# Patient Record
Sex: Male | Born: 1945 | Race: White | Hispanic: No | Marital: Married | Smoking: Current every day smoker
Health system: Southern US, Community
[De-identification: ages and names within clinical notes are randomized; demographics above are authoritative.]

## PROBLEM LIST (undated history)

## (undated) DIAGNOSIS — E119 Type 2 diabetes mellitus without complications: Secondary | ICD-10-CM

## (undated) DIAGNOSIS — F1911 Other psychoactive substance abuse, in remission: Secondary | ICD-10-CM

## (undated) DIAGNOSIS — N289 Disorder of kidney and ureter, unspecified: Secondary | ICD-10-CM

## (undated) DIAGNOSIS — Z720 Tobacco use: Secondary | ICD-10-CM

## (undated) DIAGNOSIS — Z7901 Long term (current) use of anticoagulants: Secondary | ICD-10-CM

## (undated) DIAGNOSIS — J329 Chronic sinusitis, unspecified: Secondary | ICD-10-CM

## (undated) DIAGNOSIS — I4891 Unspecified atrial fibrillation: Principal | ICD-10-CM

## (undated) DIAGNOSIS — I5023 Acute on chronic systolic (congestive) heart failure: Secondary | ICD-10-CM

## (undated) HISTORY — DX: Type 2 diabetes mellitus without complications: E11.9

## (undated) HISTORY — PX: NASAL SEPTUM SURGERY: SHX37

---

## 2011-04-19 ENCOUNTER — Encounter (HOSPITAL_COMMUNITY): Payer: Self-pay | Admitting: Emergency Medicine

## 2011-04-19 ENCOUNTER — Emergency Department (INDEPENDENT_AMBULATORY_CARE_PROVIDER_SITE_OTHER)
Admission: EM | Admit: 2011-04-19 | Discharge: 2011-04-19 | Disposition: A | Discharge status: Home / Self Care | Payer: BC Managed Care – PPO | Source: Home / Self Care | Attending: Family Medicine | Admitting: Family Medicine

## 2011-04-19 DIAGNOSIS — R059 Cough, unspecified: Secondary | ICD-10-CM

## 2011-04-19 DIAGNOSIS — J310 Chronic rhinitis: Secondary | ICD-10-CM

## 2011-04-19 DIAGNOSIS — R05 Cough: Secondary | ICD-10-CM

## 2011-04-19 HISTORY — DX: Chronic sinusitis, unspecified: J32.9

## 2011-04-19 MED ORDER — FLUTICASONE PROPIONATE 50 MCG/ACT NA SUSP: 2.0000 | Freq: Every day | NASAL | Status: DC

## 2011-04-19 MED ORDER — GUAIFENESIN-CODEINE 100-10 MG/5ML PO SYRP: 5.0000 mL | ORAL_SOLUTION | Freq: Four times a day (QID) | ORAL | Status: AC | PRN

## 2011-04-19 NOTE — ED Provider Notes (Signed)
History     CSN: [id number]  Arrival date & time 04/19/11  5366   First MD Initiated Contact with Patient 04/19/11 0831      Chief Complaint  Patient presents with  . Cough  . Sinusitis    (Consider location/radiation/quality/duration/timing/severity/associated sxs/prior treatment) HPI Comments: Jadore presents for evaluation of persistent non-productive cough over the last 2 days. He reports worsening of symptoms at night, when he is supine. He denies fever. He does smoke cigarettes. He reports reduction in sleep secondary to post-nasal drainage and cough.   Patient is a 66 y.o. male presenting with cough. The history is provided by the patient.  Cough This is a new problem. The current episode started 2 days ago. The problem occurs constantly. The problem has not changed since onset.The cough is non-productive. Associated symptoms include ear pain, rhinorrhea and shortness of breath. Pertinent negatives include no chest pain, no chills, no sore throat and no wheezing. He has tried nothing for the symptoms. He is a smoker. His past medical history does not include bronchitis, pneumonia, COPD, emphysema or asthma.    Past Medical History  Diagnosis Date  . Sinusitis     Past Surgical History  Procedure Date  . Nasal septum surgery     30 YRS AGO    No family history on file.  History  Substance Use Topics  . Smoking status: Current Everyday Smoker  . Smokeless tobacco: Not on file  . Alcohol Use: No      Review of Systems  Constitutional: Negative.  Negative for fever and chills.  HENT: Positive for ear pain, congestion, rhinorrhea and postnasal drip. Negative for sore throat and sinus pressure.   Eyes: Negative.   Respiratory: Positive for cough and shortness of breath. Negative for wheezing.   Cardiovascular: Negative.  Negative for chest pain.  Gastrointestinal: Negative.   Genitourinary: Negative.   Musculoskeletal: Negative.   Skin: Negative.   Neurological:  Negative.     Allergies  Review of patient's allergies indicates no known allergies.  Home Medications   Current Outpatient Rx  Name Route Sig Dispense Refill  . FLUTICASONE PROPIONATE 50 MCG/ACT NA SUSP Nasal Place 2 sprays into the nose daily. 16 g 2  . GUAIFENESIN-CODEINE 100-10 MG/5ML PO SYRP Oral Take 5 mLs by mouth every 6 (six) hours as needed for cough or congestion. 120 mL 0    BP 130/87  Pulse 98  Temp(Src) 97.7 F (36.5 C) (Oral)  Resp 22  SpO2 96%  Physical Exam  Nursing note and vitals reviewed. Constitutional: He is oriented to person, place, and time. He appears well-developed and well-nourished.  HENT:  Head: Normocephalic and atraumatic.  Right Ear: Tympanic membrane is retracted. A middle ear effusion is present.  Left Ear: Tympanic membrane is retracted. A middle ear effusion is present.  Mouth/Throat: Uvula is midline, oropharynx is clear and moist and mucous membranes are normal.  Eyes: EOM are normal.  Neck: Normal range of motion.  Pulmonary/Chest: Effort normal and breath sounds normal. He has no wheezes. He has no rhonchi. He has no rales.  Musculoskeletal: Normal range of motion.  Neurological: He is alert and oriented to person, place, and time.  Skin: Skin is warm and dry.  Psychiatric: His behavior is normal.    ED Course  Procedures (including critical care time)  Labs Reviewed - No data to display No results found.   1. Rhinitis   2. Cough       MDM  rx given for fluticasone and guaifenesin AC        Richardo Priest, MD 04/19/11 [phone]

## 2011-04-19 NOTE — ED Notes (Signed)
PT HERE WITH CONSTANT COUGH,POST NASAL DRAINAGE,GREENISH MUCOUS THAT STARTED YESTERDAY BUT HAS WORSENED WITH SOB ON EXERT.SATS 96% R/A.LUNGS DIMINISHED BUT CLEAR.PT AHS BEEN USING OTC NYQUIL BUT NO RELIEF

## 2011-04-22 ENCOUNTER — Inpatient Hospital Stay (HOSPITAL_COMMUNITY)
Admission: EM | Admit: 2011-04-22 | Discharge: 2011-04-30 | DRG: 544 | Disposition: A | Discharge status: Home / Self Care | Payer: BC Managed Care – PPO | Attending: Internal Medicine | Admitting: Internal Medicine

## 2011-04-22 ENCOUNTER — Emergency Department (HOSPITAL_COMMUNITY): Payer: BC Managed Care – PPO

## 2011-04-22 ENCOUNTER — Other Ambulatory Visit: Payer: Self-pay

## 2011-04-22 ENCOUNTER — Emergency Department (HOSPITAL_COMMUNITY)
Admission: EM | Admit: 2011-04-22 | Discharge: 2011-04-22 | Disposition: A | Discharge status: Nursing Home (Includes ICF) | Payer: BC Managed Care – PPO | Source: Home / Self Care | Attending: Emergency Medicine | Admitting: Emergency Medicine

## 2011-04-22 ENCOUNTER — Encounter (HOSPITAL_COMMUNITY): Payer: Self-pay | Admitting: Emergency Medicine

## 2011-04-22 ENCOUNTER — Encounter (HOSPITAL_COMMUNITY): Payer: Self-pay

## 2011-04-22 DIAGNOSIS — J189 Pneumonia, unspecified organism: Secondary | ICD-10-CM

## 2011-04-22 DIAGNOSIS — I509 Heart failure, unspecified: Secondary | ICD-10-CM | POA: Diagnosis present

## 2011-04-22 DIAGNOSIS — Z79899 Other long term (current) drug therapy: Secondary | ICD-10-CM

## 2011-04-22 DIAGNOSIS — Z23 Encounter for immunization: Secondary | ICD-10-CM

## 2011-04-22 DIAGNOSIS — I4891 Unspecified atrial fibrillation: Principal | ICD-10-CM

## 2011-04-22 DIAGNOSIS — J329 Chronic sinusitis, unspecified: Secondary | ICD-10-CM | POA: Diagnosis present

## 2011-04-22 DIAGNOSIS — F17200 Nicotine dependence, unspecified, uncomplicated: Secondary | ICD-10-CM | POA: Diagnosis present

## 2011-04-22 DIAGNOSIS — N289 Disorder of kidney and ureter, unspecified: Secondary | ICD-10-CM | POA: Diagnosis present

## 2011-04-22 DIAGNOSIS — I5023 Acute on chronic systolic (congestive) heart failure: Secondary | ICD-10-CM

## 2011-04-22 DIAGNOSIS — J9600 Acute respiratory failure, unspecified whether with hypoxia or hypercapnia: Secondary | ICD-10-CM | POA: Diagnosis present

## 2011-04-22 DIAGNOSIS — I059 Rheumatic mitral valve disease, unspecified: Secondary | ICD-10-CM | POA: Diagnosis present

## 2011-04-22 DIAGNOSIS — N182 Chronic kidney disease, stage 2 (mild): Secondary | ICD-10-CM | POA: Insufficient documentation

## 2011-04-22 DIAGNOSIS — J9601 Acute respiratory failure with hypoxia: Secondary | ICD-10-CM | POA: Diagnosis present

## 2011-04-22 DIAGNOSIS — F1911 Other psychoactive substance abuse, in remission: Secondary | ICD-10-CM | POA: Diagnosis present

## 2011-04-22 DIAGNOSIS — I428 Other cardiomyopathies: Secondary | ICD-10-CM

## 2011-04-22 DIAGNOSIS — Z720 Tobacco use: Secondary | ICD-10-CM | POA: Diagnosis present

## 2011-04-22 DIAGNOSIS — Z7901 Long term (current) use of anticoagulants: Secondary | ICD-10-CM

## 2011-04-22 HISTORY — DX: Unspecified atrial fibrillation: I48.91

## 2011-04-22 HISTORY — DX: Tobacco use: Z72.0

## 2011-04-22 HISTORY — DX: Other psychoactive substance abuse, in remission: F19.11

## 2011-04-22 HISTORY — DX: Acute on chronic systolic (congestive) heart failure: I50.23

## 2011-04-22 HISTORY — DX: Disorder of kidney and ureter, unspecified: N28.9

## 2011-04-22 HISTORY — DX: Long term (current) use of anticoagulants: Z79.01

## 2011-04-22 LAB — DIFFERENTIAL
Basophils Absolute: 0 10*3/uL (ref 0.0–0.1)
Eosinophils Absolute: 0.1 10*3/uL (ref 0.0–0.7)
Eosinophils Absolute: 0.1 10*3/uL (ref 0.0–0.7)
Eosinophils Relative: 1 % (ref 0–5)
Lymphocytes Relative: 18 % (ref 12–46)
Lymphs Abs: 1.8 10*3/uL (ref 0.7–4.0)
Neutrophils Relative %: 74 % (ref 43–77)

## 2011-04-22 LAB — GLUCOSE, CAPILLARY

## 2011-04-22 LAB — CBC
MCH: 30.3 pg (ref 26.0–34.0)
MCH: 30.2 pg (ref 26.0–34.0)
MCV: 88.8 fL (ref 78.0–100.0)
Platelets: 306 10*3/uL (ref 150–400)
Platelets: 313 10*3/uL (ref 150–400)
RBC: 4.7 MIL/uL (ref 4.22–5.81)
RDW: 14.4 % (ref 11.5–15.5)
WBC: 9.6 10*3/uL (ref 4.0–10.5)
WBC: 9.5 10*3/uL (ref 4.0–10.5)

## 2011-04-22 LAB — PROTIME-INR
INR: 1.14 (ref 0.00–1.49)
Prothrombin Time: 14.8 seconds (ref 11.6–15.2)

## 2011-04-22 LAB — TSH: TSH: 1.758 u[IU]/mL (ref 0.350–4.500)

## 2011-04-22 LAB — POCT I-STAT, CHEM 8
BUN: 19 mg/dL (ref 6–23)
Calcium, Ion: 1.21 mmol/L (ref 1.12–1.32)
Glucose, Bld: 113 mg/dL — ABNORMAL HIGH (ref 70–99)
Hemoglobin: 15.6 g/dL (ref 13.0–17.0)
Sodium: 140 mEq/L (ref 135–145)
TCO2: 25 mmol/L (ref 0–100)
TCO2: 25 mmol/L (ref 0–100)

## 2011-04-22 LAB — CARDIAC PANEL(CRET KIN+CKTOT+MB+TROPI)
Relative Index: INVALID (ref 0.0–2.5)
Troponin I: <0.3 ng/mL (ref <0.30)

## 2011-04-22 MED ORDER — PNEUMOCOCCAL VAC POLYVALENT 25 MCG/0.5ML IJ INJ
0.5000 mL | INJECTION | INTRAMUSCULAR | Status: AC
  Administered 2011-04-23: 0.5 mL via INTRAMUSCULAR
  Filled 2011-04-22: qty 0.5

## 2011-04-22 MED ORDER — HEPARIN BOLUS VIA INFUSION
2000.0000 [IU] | Freq: Once | INTRAVENOUS | Status: AC
  Administered 2011-04-22: 2000 [IU] via INTRAVENOUS
  Filled 2011-04-22: qty 2000

## 2011-04-22 MED ORDER — HEPARIN SOD (PORCINE) IN D5W 100 UNIT/ML IV SOLN
2450.0000 [IU]/h | INTRAVENOUS | Status: DC
  Administered 2011-04-22: 1400 [IU]/h via INTRAVENOUS
  Administered 2011-04-23: 2450 [IU]/h via INTRAVENOUS
  Administered 2011-04-23: 1850 [IU]/h via INTRAVENOUS
  Administered 2011-04-23 – 2011-04-28 (×9): 2450 [IU]/h via INTRAVENOUS
  Filled 2011-04-22 (×20): qty 250

## 2011-04-22 MED ORDER — DILTIAZEM HCL 100 MG IV SOLR
5.0000 mg/h | INTRAVENOUS | Status: DC
  Administered 2011-04-23: 15 mg/h via INTRAVENOUS
  Filled 2011-04-22: qty 100

## 2011-04-22 MED ORDER — ONDANSETRON HCL 4 MG PO TABS: 4.0000 mg | ORAL_TABLET | Freq: Four times a day (QID) | ORAL | Status: DC | PRN

## 2011-04-22 MED ORDER — NICOTINE 21 MG/24HR TD PT24
21.0000 mg | MEDICATED_PATCH | Freq: Every day | TRANSDERMAL | Status: DC
  Administered 2011-04-23 – 2011-04-30 (×8): 21 mg via TRANSDERMAL
  Filled 2011-04-22 (×9): qty 1

## 2011-04-22 MED ORDER — DILTIAZEM HCL 50 MG/10ML IV SOLN
10.0000 mg | INTRAVENOUS | Status: AC
  Administered 2011-04-22: 10 mg via INTRAVENOUS
  Filled 2011-04-22: qty 2

## 2011-04-22 MED ORDER — SODIUM CHLORIDE 0.9 % IV BOLUS (SEPSIS)
500.0000 mL | Freq: Once | INTRAVENOUS | Status: AC
  Administered 2011-04-22: 500 mL via INTRAVENOUS

## 2011-04-22 MED ORDER — DILTIAZEM HCL 50 MG/10ML IV SOLN
20.0000 mg | Freq: Once | INTRAVENOUS | Status: AC
  Administered 2011-04-22: 20 mg via INTRAVENOUS

## 2011-04-22 MED ORDER — DEXTROSE 5 % IV SOLN
1.0000 g | INTRAVENOUS | Status: DC
  Administered 2011-04-22 – 2011-04-24 (×3): 1 g via INTRAVENOUS
  Filled 2011-04-22 (×3): qty 10

## 2011-04-22 MED ORDER — GUAIFENESIN-CODEINE 100-10 MG/5ML PO SOLN
5.0000 mL | Freq: Four times a day (QID) | ORAL | Status: DC | PRN
  Administered 2011-04-22 – 2011-04-26 (×7): 5 mL via ORAL
  Filled 2011-04-22 (×7): qty 5

## 2011-04-22 MED ORDER — FLUTICASONE PROPIONATE 50 MCG/ACT NA SUSP
2.0000 | Freq: Every day | NASAL | Status: DC
  Administered 2011-04-23 – 2011-04-30 (×7): 2 via NASAL
  Filled 2011-04-22: qty 16

## 2011-04-22 MED ORDER — DILTIAZEM HCL 25 MG/5ML IV SOLN
INTRAVENOUS | Status: AC
  Filled 2011-04-22: qty 5

## 2011-04-22 MED ORDER — DEXTROSE 5 % IV SOLN
1.0000 g | Freq: Once | INTRAVENOUS | Status: AC
  Administered 2011-04-22: 1 g via INTRAVENOUS
  Filled 2011-04-22: qty 10

## 2011-04-22 MED ORDER — SODIUM CHLORIDE 0.9 % IV SOLN
INTRAVENOUS | Status: DC
  Administered 2011-04-22: 10 mL/h via INTRAVENOUS

## 2011-04-22 MED ORDER — DEXTROSE 5 % IV SOLN
500.0000 mg | Freq: Once | INTRAVENOUS | Status: AC
  Administered 2011-04-22: 500 mg via INTRAVENOUS
  Filled 2011-04-22: qty 500

## 2011-04-22 MED ORDER — METOPROLOL TARTRATE 1 MG/ML IV SOLN
5.0000 mg | Freq: Four times a day (QID) | INTRAVENOUS | Status: DC | PRN
  Administered 2011-04-22 – 2011-04-23 (×3): 5 mg via INTRAVENOUS
  Filled 2011-04-22 (×3): qty 5

## 2011-04-22 MED ORDER — ACETAMINOPHEN 650 MG RE SUPP: 650.0000 mg | Freq: Four times a day (QID) | RECTAL | Status: DC | PRN

## 2011-04-22 MED ORDER — SODIUM CHLORIDE 0.9 % IV SOLN
INTRAVENOUS | Status: DC
  Administered 2011-04-22: 11:00:00 via INTRAVENOUS

## 2011-04-22 MED ORDER — HEPARIN BOLUS VIA INFUSION
5000.0000 [IU] | Freq: Once | INTRAVENOUS | Status: AC
  Administered 2011-04-22: 5000 [IU] via INTRAVENOUS

## 2011-04-22 MED ORDER — DEXTROSE 5 % IV SOLN
500.0000 mg | INTRAVENOUS | Status: DC
  Administered 2011-04-23 – 2011-04-24 (×2): 500 mg via INTRAVENOUS
  Filled 2011-04-22 (×4): qty 500

## 2011-04-22 MED ORDER — ACETAMINOPHEN 325 MG PO TABS: 650.0000 mg | ORAL_TABLET | Freq: Four times a day (QID) | ORAL | Status: DC | PRN

## 2011-04-22 MED ORDER — DILTIAZEM HCL 100 MG IV SOLR
15.0000 mg/h | INTRAVENOUS | Status: DC
  Administered 2011-04-22 (×2): 15 mg/h via INTRAVENOUS

## 2011-04-22 MED ORDER — DILTIAZEM HCL 100 MG IV SOLR
10.0000 mg/h | INTRAVENOUS | Status: DC
  Administered 2011-04-22: 10 mg/h via INTRAVENOUS

## 2011-04-22 MED ORDER — ONDANSETRON HCL 4 MG/2ML IJ SOLN: 4.0000 mg | Freq: Four times a day (QID) | INTRAMUSCULAR | Status: DC | PRN

## 2011-04-22 MED ORDER — LEVALBUTEROL HCL 0.63 MG/3ML IN NEBU
0.6300 mg | INHALATION_SOLUTION | Freq: Four times a day (QID) | RESPIRATORY_TRACT | Status: DC | PRN
  Administered 2011-04-22: 0.63 mg via RESPIRATORY_TRACT
  Filled 2011-04-22 (×2): qty 3

## 2011-04-22 NOTE — ED Notes (Signed)
[facility] notified.   

## 2011-04-22 NOTE — Progress Notes (Signed)
ANTICOAGULATION CONSULT NOTE   Pharmacy Consult for Heparin Indication: atrial fibrillation  No Known Allergies  Patient Measurements: Height: 6\' 2"  (188 cm) Weight: 210 lb (95.255 kg) IBW/kg (Calculated) : 82.2   Vital Signs: Temp: 97.4 F (36.3 C) (01/24 1951) Temp src: Oral (01/24 1951) BP: 101/71 mmHg (01/24 2030) Pulse Rate: 110  (01/24 2100)  Labs:  Basename 04/22/11 2205 04/22/11 2005 04/22/11 1030 04/22/11 1024 04/22/11 0855 04/22/11 0850  HGB -- -- 15.0 14.2 -- --  HCT -- -- 44.0 41.9 46.0 --  PLT -- -- -- 313 -- 306  APTT -- -- -- 38* -- --  LABPROT -- -- -- 14.8 -- --  INR -- -- -- 1.14 -- --  HEPARINUNFRC <0.10* -- -- -- -- --  CREATININE -- -- 1.10 -- 1.10 --  CKTOTAL -- 83 -- -- -- --  CKMB -- 2.7 -- -- -- --  TROPONINI -- <0.30 -- <0.30 -- --   Estimated Creatinine Clearance: 79.9 ml/min (by C-G formula based on Cr of 1.1).  Assessment: 66 year old male with Afib for Heparin  Goal of Therapy:  Heparin level 0.3-0.7 units/ml   Plan:  1) Heparin bolus 2000 units IV  2) Increase Heparin 1850 units / hr 3) Follow-up am labs.   Darryl [name], Darryl [name] 04/22/2011,10:48 PM

## 2011-04-22 NOTE — ED Notes (Addendum)
[facility]-pt is transfer from urgent care. Pt reports continuous sob and cough since tx last week for URI. Pt found to be in rapid afib with rvr, with movement HR up to 180s. Denies chest or sob at this time but [facility] reports pt with sob when talking. 20g(L)hand.

## 2011-04-22 NOTE — H&P (Addendum)
PCP:  [name], MD, MD   DOA:  04/22/2011  9:58 AM  Chief Complaint:  Shortness of breath and post nasal drainage  HPI: This is a 66 years old Caucasian man with no significant medical problems who was seen in urgent care clinic on Monday for postnasal drip and shortness of breath, he was prescribed Flonase nasal spray and cough syrup. He continued to have worsening shortness of breath with minimal exertion associated with postnasal drip and coughing up yellowish mucus sputum production. He denies any chest pain, fever or chills. In the ED EKG showed atrial fibrillation with RVR and chest x-ray showed finding suggestive of pneumonia. He was started on Cardizem drip and given one dose of Rocephin and Zithromax and we are asked to admit for further management  Allergies: No Known Allergies  Prior to Admission medications   Medication Sig Start Date End Date Taking? Authorizing Provider  DM-Doxylamine-Acetaminophen (NYQUIL COLD & FLU PO) Take 30 mLs by mouth at bedtime as needed. For cold symptoms   Yes Historical Provider, MD  fluticasone (FLONASE) 50 MCG/ACT nasal spray Place 2 sprays into the nose daily. 04/19/11 04/18/12 Yes Richardo Priest, MD  guaiFENesin-codeine (ROBITUSSIN AC) 100-10 MG/5ML syrup Take 5 mLs by mouth every 6 (six) hours as needed for cough or congestion. 04/19/11 04/29/11 Yes Richardo Priest, MD    Past Medical History  Diagnosis Date  . Sinusitis     Past Surgical History  Procedure Date  . Nasal septum surgery     30 YRS AGO    Social History: Work  in a warehouse, smokes cigarettes for more about 40 years, used to smoke a pack a day currently smokes half a pack a day. Used to drink alcohol or use illicit drugs in the However quit long time ago as per him.  Family history Mother and father died of lung disease and cancer. His father had coronary heart disease.  Review of Systems:  Constitutional: Denies fever, chills, diaphoresis, appetite change and fatigue.    HEENT: Complain of postnasal drip and congestion. Denies photophobia, eye pain, redness, hearing loss, ear pain, , mouth sores, trouble swallowing, neck pain, neck stiffness and tinnitus.   Respiratory: C/O  SOB, DOE, cough, denies chest tightness,  and wheezing.   Cardiovascular: Denies chest pain, palpitations and leg swelling.  Gastrointestinal: Denies nausea, vomiting, abdominal pain, diarrhea, constipation, blood in stool and abdominal distention.  Genitourinary: Denies dysuria, urgency, frequency, hematuria, flank pain and difficulty urinating.  Musculoskeletal: Denies myalgias, back pain, joint swelling, arthralgias and gait problem.  Skin: Denies pallor, rash and wound.  Neurological: Denies dizziness, seizures, syncope, weakness, light-headedness, numbness and headaches.  Hematological: Denies adenopathy. Easy bruising, personal or family bleeding history  Psychiatric/Behavioral: Denies suicidal ideation, mood changes, confusion, nervousness, sleep disturbance and agitation   Physical Exam:  Filed Vitals:   04/22/11 1130 04/22/11 1200 04/22/11 1245 04/22/11 1315  BP: 122/83 108/79 105/72 113/79  Pulse: 130 40 57 85  Temp:      TempSrc:      Resp: 24 29 25 17   SpO2: 93% 95% 96% 97%    Constitutional: Vital signs reviewed.  Patient is a well-developed and well-nourished in mild  acute distress and cooperative with exam. Alert and oriented x3.  Head: Normocephalic and atraumatic Eyes: PERRL, EOMI, conjunctivae normal, No scleral icterus.  Neck: Supple, Trachea midline normal ROM, No JVD, mass, thyromegaly, or carotid bruit present.  Cardiovascular: Irregular irregular rate and rhythm, no MRG, pulses symmetric and intact  bilaterally Pulmonary/Chest: Right base Rales noted, no wheezes, , or rhonchi Abdominal: Soft. Non-tender, non-distended, bowel sounds are normal, no masses, organomegaly, or guarding present.  Ext: no edema and no cyanosis, pulses palpable bilaterally    Neurological: A&O x3, Strenght is normal and symmetric bilaterally, cranial nerve II-XII are grossly intact, no focal motor deficit, sensory intact to light touch bilaterally.    Labs on Admission:  Results for orders placed during the hospital encounter of 04/22/11 (from the past 48 hour(s))  CBC     Status: Normal   Collection Time   04/22/11 10:24 AM      Component Value Range Comment   WBC 9.5  4.0 - 10.5 (K/uL)    RBC 4.70  4.22 - 5.81 (MIL/uL)    Hemoglobin 14.2  13.0 - 17.0 (g/dL)    HCT 16.1  09.6 - 04.5 (%)    MCV 89.1  78.0 - 100.0 (fL)    MCH 30.2  26.0 - 34.0 (pg)    MCHC 33.9  30.0 - 36.0 (g/dL)    RDW 40.9  81.1 - 91.4 (%)    Platelets 313  150 - 400 (K/uL)   DIFFERENTIAL     Status: Normal   Collection Time   04/22/11 10:24 AM      Component Value Range Comment   Neutrophils Relative 74  43 - 77 (%)    Neutro Abs 7.0  1.7 - 7.7 (K/uL)    Lymphocytes Relative 18  12 - 46 (%)    Lymphs Abs 1.8  0.7 - 4.0 (K/uL)    Monocytes Relative 7  3 - 12 (%)    Monocytes Absolute 0.7  0.1 - 1.0 (K/uL)    Eosinophils Relative 1  0 - 5 (%)    Eosinophils Absolute 0.1  0.0 - 0.7 (K/uL)    Basophils Relative 0  0 - 1 (%)    Basophils Absolute 0.0  0.0 - 0.1 (K/uL)   PROTIME-INR     Status: Normal   Collection Time   04/22/11 10:24 AM      Component Value Range Comment   Prothrombin Time 14.8  11.6 - 15.2 (seconds)    INR 1.14  0.00 - 1.49    APTT     Status: Abnormal   Collection Time   04/22/11 10:24 AM      Component Value Range Comment   aPTT 38 (*) 24 - 37 (seconds)   TROPONIN I     Status: Normal   Collection Time   04/22/11 10:24 AM      Component Value Range Comment   Troponin I <0.30  <0.30 (ng/mL)   POCT I-STAT, CHEM 8     Status: Abnormal   Collection Time   04/22/11 10:30 AM      Component Value Range Comment   Sodium 140  135 - 145 (mEq/L)    Potassium 4.3  3.5 - 5.1 (mEq/L)    Chloride 107  96 - 112 (mEq/L)    BUN 19  6 - 23 (mg/dL)    Creatinine, Ser  7.82  0.50 - 1.35 (mg/dL)    Glucose, Bld 956 (*) 70 - 99 (mg/dL)    Calcium, Ion 2.13  1.12 - 1.32 (mmol/L)    TCO2 25  0 - 100 (mmol/L)    Hemoglobin 15.0  13.0 - 17.0 (g/dL)    HCT 08.6  57.8 - 46.9 (%)     Radiological Exams on Admission: Dg Chest Port 1  View  04/22/2011  *RADIOLOGY REPORT*  Clinical Data: Productive cough  PORTABLE CHEST - 1 VIEW  Comparison: None  Findings: The heart size is normal.  Atelectasis is identified within the left base.  There is a hazy opacity overlying the right base which may represent infiltrate or asymmetric edema.  The visualized osseous structures unremarkable.  IMPRESSION:  1.  Hazy opacity overlying the right base may represent infiltrate, asymmetric edema or posterior layering effusion. Suggest more definitive assessment with upright PA and lateral chest radiograph.  2.  Left base atelectasis.  Original Report Authenticated By: [name], M.D.   Assessment/Plan Principal Problem:  *Atrial fibrillation with RVR Active Problems:  Pneumonia  Sinusitis Plan Admit to step down unit Continue Cardizem drip and titrate, start heparin drip cycle cycle cardiac enzymes Check 2 D echocardiogram and TSH Continue Flonase nasal spray, Rocephin and Zithromax D C IV fluids  [facility]iology service was consulted Nicotine patch, tobacco cessation counseling   Spent on Admission: 50 minutes  Kenley Troop 04/22/2011, 2:34 PM

## 2011-04-22 NOTE — ED Notes (Signed)
Attempting to page admitting MD, triad team 6, to inquire about titratable cardizem and pt bed acuity as tele can not take pt.

## 2011-04-22 NOTE — Progress Notes (Signed)
ANTICOAGULATION CONSULT NOTE - Initial Consult  Pharmacy Consult for Heparin Indication: atrial fibrillation  No Known Allergies  Patient Measurements: Weight: 210 lb (95.255 kg)  Vital Signs: Temp: 97.7 F (36.5 C) (01/24 1000) Temp src: Oral (01/24 1000) BP: 122/87 mmHg (01/24 1415) Pulse Rate: 81  (01/24 1415)  Labs:  Basename 04/22/11 1030 04/22/11 1024 04/22/11 0855 04/22/11 0850  HGB 15.0 14.2 -- --  HCT 44.0 41.9 46.0 --  PLT -- 313 -- 306  APTT -- 38* -- --  LABPROT -- 14.8 -- --  INR -- 1.14 -- --  HEPARINUNFRC -- -- -- --  CREATININE 1.10 -- 1.10 --  CKTOTAL -- -- -- --  CKMB -- -- -- --  TROPONINI -- <0.30 -- --   CrCl is unknown because there is no height on file for the current visit.  Medical History: Past Medical History  Diagnosis Date  . Sinusitis   . History of tobacco abuse   . History of intravenous drug use in remission     Smoked marijuana 1-2/ week up until about a year ago. Snorted and/or smoke cocaine  approximately 1/month until 5 years ago    Medications:  Scheduled:    . azithromycin (ZITHROMAX) 500 MG IVPB  500 mg Intravenous Once  . cefTRIAXone (ROCEPHIN)  IV  1 g Intravenous Once  . diltiazem  10 mg Intravenous To Major  . diltiazem  10 mg Intravenous To Major  . diltiazem  20 mg Intravenous Once  . sodium chloride  500 mL Intravenous Once    Assessment: 66 year old admitted with pneumonia and Afib, Scr = 1.10, to start heparin  Goal of Therapy:  Heparin level 0.3-0.7 units/ml   Plan:  1) Heparin bolus 5000 units IV x 1 2) Heparin drip at 1400 units / hr 3) Heparin level 6 hours after heparin starts 4) Daily heparin level / CBC  Thank you. Elwin Sleight 04/22/2011,3:10 PM

## 2011-04-22 NOTE — ED Notes (Signed)
Spoke to [facility] 

## 2011-04-22 NOTE — Consult Note (Signed)
CARDIOLOGY CONSULT NOTE  Patient ID: Darryl [name] MRN: [id number], DOB/AGE: 1945-12-21   Admit date: 04/22/2011 Date of Consult: 04/22/2011   Primary Physician: [name], MD, MD Primary Cardiologist: new  Pt. Profile: This is a 66 y/o male who presented to [facility] secondary to worsening SOB in the last week and found to be in a-fib upon initial examination. Problem List: Past Medical History  Diagnosis Date  . Sinusitis     A.  s/p sinus surgery in past  . Tobacco abuse     A.  45 yrs 1/2-1ppd - currently 1/2 ppd.  . Atrial fibrillation     A.  04/22/2011  . Pneumonia     A.  03/2011  . History of drug abuse     Smoked marijuana 1-2/ week up until about a year ago. Snorted and/or smoke cocaine  approximately 1/month until 5 years ago    Past Surgical History  Procedure Date  . Nasal septum surgery     30 YRS AGO     Allergies: No Known Allergies  HPI:    66 y/o male with the above problem list.  He hasn't seen a doctor in at least 10 yrs.  He has chronic sinus congestion and about 7-10 days ago his congestion worsened and moved into his chest.  He later developed a prod cough with yellow sputum.  On Sunday of this week, he began to experience DOE and on Monday he came into the ED.  He was treated for sinus congestion and cough and d/c from ED.  Of note, his HR was recorded as being 98 in the ER that day.  No cardiac exam was documented.  Unfortunately, his DOE has progressed and he presented to UC today where he was found to be in a.fib with rvr.  He was transferred to the [facility] ED where he was treated with Dilt bolus and gtt (titrated to 15mg /hr), and also placed on heparin.  His cxr suggest RLL pna and he has been placed on IV abx and admitted by IM.  We have been asked to eval re: afib.  Pt denies c/p, palps.  He has no knowledge as to the duration of his a.fib.  Biggest complaint @ this time is cough.   Inpatient Medications:     . azithromycin (ZITHROMAX) 500 MG  IVPB  500 mg Intravenous Once  . azithromycin  500 mg Intravenous Q24H  . cefTRIAXone (ROCEPHIN)  IV  1 g Intravenous Once  . cefTRIAXone (ROCEPHIN)  IV  1 g Intravenous Q24H  . diltiazem  10 mg Intravenous To Major  . diltiazem  10 mg Intravenous To Major  . diltiazem  20 mg Intravenous Once  . fluticasone  2 spray Each Nare Daily  . heparin  5,000 Units Intravenous Once  . nicotine  21 mg Transdermal Daily  . sodium chloride  500 mL Intravenous Once    Family History  Problem Relation Age of Onset  . Lung cancer Mother     died from CA at age 31  . Lung cancer Father     died from CA at age 76  . Coronary artery disease Father 50    Pt reports his father had a "balloon placed through his arteries."     History   Social History  . Marital Status: Married    Spouse Name: N/A    Number of Children: N/A  . Years of Education: N/A   Occupational History  . 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 Smoking status: Current Everyday Smoker -- 0.5 packs/day for 45 years  . Smokeless tobacco: Not on file  . Alcohol Use: No  . Drug Use: No     has used marijuana and coacaine in past  . Sexually Active: Not on file   Other Topics Concern  . Not on file   Social History Narrative   Pt reports not having seen any medical care x 10 years with exception of visit to [facility] on 04/19/11 for SOB and sinusitis. Also reports no PMH w/ exception of a doctor, "some years ago telling me that my cholesterol was high. So I stopped eating Bologna and it went back down."     Review of Systems: General: negative for chills, fever, night sweats or weight changes.  Cardiovascular: negative for chest pain, edema, orthopnea, palpitations, or paroxysmal nocturnal dyspnea, but does c/o increasing dyspnea on exertion. Dermatological: negative for rash Respiratory: long history of chronic dry, nonproductive cough that now keeps him awake all  night Urologic: negative for hematuria Abdominal: negative for nausea, vomiting, diarrhea, bright red blood per rectum, melena, or hematemesis Neurologic: negative for visual changes, syncope, or dizziness All other systems reviewed and are otherwise negative except as noted above.  Physical Exam: Blood pressure 113/84, pulse 120, temperature 97.7 F (36.5 C), temperature source Oral, resp. rate 18, weight 210 lb (95.255 kg), SpO2 93.00%.  General: Well developed, well nourished, in no acute distress. Head: Normocephalic, atraumatic, sclera non-icteric, no xanthomas, nares are without discharge.  Neck: Supple without bruits or JVD. Lungs: chronic, non-productive cough. Wheezing and rhonchi appreciated bilaterally with decreased air movement in B lower lobe, decreased thoracic expansion and increased respiratory rate at 26. Heart: Unable to hear over breath sounds Abdomen: Soft, non-tender, non-distended, BS + x 4.  Msk:  Strength at 4/5 grossly. Extremities: No clubbing, trace edema. BUE cool to touch with decreased reperfusion time. DP/PT/Radials 2+ and equal bilaterally. Neuro: Alert and oriented X 3. Moves all extremities spontaneously. Psych: Normal affect.   Labs:   Results for orders placed during the hospital encounter of 04/22/11 (from the past 72 hour(s))  CBC     Status: Normal   Collection Time   04/22/11 10:24 AM      Component Value Range Comment   WBC 9.5  4.0 - 10.5 (K/uL)    RBC 4.70  4.22 - 5.81 (MIL/uL)    Hemoglobin 14.2  13.0 - 17.0 (g/dL)    HCT 16.1  09.6 - 04.5 (%)    MCV 89.1  78.0 - 100.0 (fL)    MCH 30.2  26.0 - 34.0 (pg)    MCHC 33.9  30.0 - 36.0 (g/dL)    RDW 40.9  81.1 - 91.4 (%)    Platelets 313  150 - 400 (K/uL)   DIFFERENTIAL     Status: Normal   Collection Time   04/22/11 10:24 AM      Component Value Range Comment   Neutrophils Relative 74  43 - 77 (%)    Neutro Abs 7.0  1.7 - 7.7 (K/uL)    Lymphocytes Relative 18  12 - 46 (%)    Lymphs Abs  1.8  0.7 - 4.0 (K/uL)    Monocytes Relative 7  3 - 12 (%)    Monocytes Absolute 0.7  0.1 - 1.0 (K/uL)    Eosinophils Relative 1  0 - 5 (%)  Eosinophils Absolute 0.1  0.0 - 0.7 (K/uL)    Basophils Relative 0  0 - 1 (%)    Basophils Absolute 0.0  0.0 - 0.1 (K/uL)   PROTIME-INR     Status: Normal   Collection Time   04/22/11 10:24 AM      Component Value Range Comment   Prothrombin Time 14.8  11.6 - 15.2 (seconds)    INR 1.14  0.00 - 1.49    APTT     Status: Abnormal   Collection Time   04/22/11 10:24 AM      Component Value Range Comment   aPTT 38 (*) 24 - 37 (seconds)   TROPONIN I     Status: Normal   Collection Time   04/22/11 10:24 AM      Component Value Range Comment   Troponin I <0.30  <0.30 (ng/mL)   TSH     Status: Normal   Collection Time   04/22/11 10:24 AM      Component Value Range Comment   TSH 1.758  0.350 - 4.500 (uIU/mL)   POCT I-STAT, CHEM 8     Status: Abnormal   Collection Time   04/22/11 10:30 AM      Component Value Range Comment   Sodium 140  135 - 145 (mEq/L)    Potassium 4.3  3.5 - 5.1 (mEq/L)    Chloride 107  96 - 112 (mEq/L)    BUN 19  6 - 23 (mg/dL)    Creatinine, Ser 9.62  0.50 - 1.35 (mg/dL)    Glucose, Bld 952 (*) 70 - 99 (mg/dL)    Calcium, Ion 8.41  1.12 - 1.32 (mmol/L)    TCO2 25  0 - 100 (mmol/L)    Hemoglobin 15.0  13.0 - 17.0 (g/dL)    HCT 32.4  40.1 - 02.7 (%)     Radiology/Studies: Dg Chest Port 1 View  04/22/2011  *RADIOLOGY REPORT*  Clinical Data: Productive cough  PORTABLE CHEST - 1 VIEW  Comparison: None  Findings: The heart size is normal.  Atelectasis is identified within the left base.  There is a hazy opacity overlying the right base which may represent infiltrate or asymmetric edema.  The visualized osseous structures unremarkable.  IMPRESSION:  1.  Hazy opacity overlying the right base may represent infiltrate, asymmetric edema or posterior layering effusion. Suggest more definitive assessment with upright PA and lateral chest  radiograph.  2.  Left base atelectasis.  Original Report Authenticated By: [name], M.D.    EKG: Atrial fibrillation, 161, no acute st/t changes.  ASSESSMENT AND PLAN:   1. CAP:  ABX per IM.  2.  AF rvr:  ? Duration.  Presumably occurred after [facility] ER visit but can't be sure (rate was 98 on Monday - ? Rhythm).  Agree with IV dilt and heparin.  Rate likely driven by #1 @ this point.  If rates difficult to control on dilt alone, will write for prn bb.  CHADS2: 1 (? Htn).  Prob not an ideal coumadin candidate going forward 2/2 lack of f/u in the past however if he doesn't convert while here, we may need to proceed with TEE and DCCV prior to d/c with plan for 4 wk course of anticoagulation.  Check echo, mg, tsh.  3. Tob abuse:  Cessation advised.  Will need formal counseling.    Signed, Nicolasa Ducking, NP 04/22/2011, 6:32 PM  Pt has 1-2 week hx of rapidly progressive DOE which occurs in the  setting of pneumonia and AF RVR of unknown duration.  He also has hx of exertional CP relieved by rest of about 2-3 m duration with CRF +cigs,+Fhx ?lipids and prior Cocaine use.    For now 1) rate control using CCB and prn IV BB 2) heparin and oOAC with warfarin or atlernative agent with plan for DCCV in 3-4 weeks  W CHADS score of 0 (thus far) would not need long term anticoagulation 3)  Will need outpt myoview given CP syndrome 4) lipids

## 2011-04-22 NOTE — ED Notes (Signed)
Spoke to [facility] and states to increase Cardizem to 15mg /hr.

## 2011-04-22 NOTE — ED Notes (Addendum)
Pharmacist states IV Cardizem is compatible with  IV azithromycin and IV cardizem is compatible with rocephin but rocephin and azithromycin are not compatible together.

## 2011-04-22 NOTE — ED Notes (Signed)
Attempted to give report to [id number], states can not take report at this time but will call me back at [phone]

## 2011-04-22 NOTE — ED Notes (Signed)
Seen Monday, reports not feeling any better.  C/o sinus drainage and states his main complaint is sob with routine activity.  Reports routine daily activities are leaving him sob.  Reports sob over the past few weeks.

## 2011-04-22 NOTE — ED Provider Notes (Signed)
History     CSN: [id number]  Arrival date & time 04/22/11  2952   First MD Initiated Contact with Patient 04/22/11 [phone]      Chief Complaint  Patient presents with  . Atrial Fibrillation  . Cough  . Shortness of Breath    (Consider location/radiation/quality/duration/timing/severity/associated sxs/prior treatment) Patient is a 66 y.o. male presenting with atrial fibrillation, cough, and shortness of breath. The history is provided by the patient.  Atrial Fibrillation This is a new problem. Associated symptoms include shortness of breath. Pertinent negatives include no chest pain, no abdominal pain and no headaches.  Cough Associated symptoms include shortness of breath. Pertinent negatives include no chest pain and no headaches.  Shortness of Breath  Associated symptoms include cough and shortness of breath. Pertinent negatives include no chest pain.   patient states he has had cough and fatigue since Sunday. He was seen in urgent care on Monday and started on cough medicines. He went back today and was found to be in atrial fibrillation with RVR. This is new for him. No chest pain. No fevers. He smokes a pack of cigarettes every day and a half. No history of atrial fibrillation. No recent trips. No recent weight loss. He does not have a doctor he sees he is only been taking NyQuil for the cough before Monday.  Past Medical History  Diagnosis Date  . Sinusitis     Past Surgical History  Procedure Date  . Nasal septum surgery     30  YRS AGO    No family history on file.  History  Substance Use Topics  . Smoking status: Current Everyday Smoker  . Smokeless tobacco: Not on file  . Alcohol Use: No      Review of Systems  Constitutional: Positive for fatigue. Negative for activity change and appetite change.  HENT: Negative for neck stiffness.   Eyes: Negative for pain.  Respiratory: Positive for cough and shortness of breath. Negative for chest tightness.     Cardiovascular: Negative for chest pain and leg swelling.  Gastrointestinal: Negative for nausea, vomiting, abdominal pain and diarrhea.  Genitourinary: Negative for flank pain.  Musculoskeletal: Negative for back pain.  Skin: Negative for rash.  Neurological: Negative for weakness, numbness and headaches.  Psychiatric/Behavioral: Negative for behavioral problems.    Allergies  Review of patient's allergies indicates no known allergies.  Home Medications   Current Outpatient Rx  Name Route Sig Dispense Refill  . NYQUIL COLD & FLU PO Oral Take 30 mLs by mouth at bedtime as needed. For cold symptoms    . FLUTICASONE PROPIONATE 50 MCG/ACT NA SUSP Nasal Place 2 sprays into the nose daily. 16 g 2  . GUAIFENESIN-CODEINE 100-10 MG/5ML PO SYRP Oral Take 5 mLs by mouth every 6 (six) hours as needed for cough or congestion. 120 mL 0    BP 105/72  Pulse 57  Temp(Src) 97.7 F (36.5 C) (Oral)  Resp 25  SpO2 96%  Physical Exam  Nursing note and vitals reviewed. Constitutional: He is oriented to person, place, and time. He appears well-developed and well-nourished.  HENT:  Head: Normocephalic and atraumatic.  Eyes: EOM are normal. Pupils are equal, round, and reactive to light.  Neck: Normal range of motion. Neck supple.  Cardiovascular: Normal heart sounds.   No murmur heard.      Tachycardia  Pulmonary/Chest: Effort normal.       Mildly harsh breath sounds  Abdominal: Soft. Bowel sounds are normal. He exhibits no distension  and no mass. There is no tenderness. There is no rebound and no guarding.  Musculoskeletal: Normal range of motion. He exhibits no edema.  Neurological: He is alert and oriented to person, place, and time. No cranial nerve deficit.  Skin: Skin is warm and dry.  Psychiatric: He has a normal mood and affect.    ED Course  Procedures (including critical care time)  Labs Reviewed  APTT - Abnormal; Notable for the following:    aPTT 38 (*)    All other  components within normal limits  POCT I-STAT, CHEM 8 - Abnormal; Notable for the following:    Glucose, Bld 113 (*)    All other components within normal limits  CBC  DIFFERENTIAL  PROTIME-INR  I-STAT, CHEM 8  TROPONIN I  TSH   Dg Chest Port 1 View  04/22/2011  *RADIOLOGY REPORT*  Clinical Data: Productive cough  PORTABLE CHEST - 1 VIEW  Comparison: None  Findings: The heart size is normal.  Atelectasis is identified within the left base.  There is a hazy opacity overlying the right base which may represent infiltrate or asymmetric edema.  The visualized osseous structures unremarkable.  IMPRESSION:  1.  Hazy opacity overlying the right base may represent infiltrate, asymmetric edema or posterior layering effusion. Suggest more definitive assessment with upright PA and lateral chest radiograph.  2.  Left base atelectasis.  Original Report Authenticated By: [name], M.D.     1. Atrial fibrillation with rapid ventricular response   2. CAP (community acquired pneumonia)     Date: 04/22/2011  Rate: 161  Rhythm: atrial fibrillation and RVR  QRS Axis: right  Intervals: normal  ST/T Wave abnormalities: normal  Conduction Disutrbances:afib  Narrative Interpretation:   Old EKG Reviewed: none available  CRITICAL CARE Performed by: Billee Cashing   Total critical care time: 30  Critical care time was exclusive of separately billable procedures and treating other patients.  Critical care was necessary to treat or prevent imminent or life-threatening deterioration.  Critical care was time spent personally by me on the following activities: development of treatment plan with patient and/or surrogate as well as nursing, discussions with consultants, evaluation of patient's response to treatment, examination of patient, obtaining history from patient or surrogate, ordering and performing treatments and interventions, ordering and review of laboratory studies, ordering and review  of radiographic studies, pulse oximetry and re-evaluation of patient's condition.   MDM  Patient presented with shortness of breath and cough for the last several days. Possible pneumonia on x-ray. He treated as acute acquired pneumonia. He also was discovered is a new onset A. fib with RVR. He required repeated boluses of Cardizem and IV infusion of Cardizem to help control the rate. he will be admitted to the hospital        [facility] R. Rubin Payor, MD 04/22/11 1312

## 2011-04-22 NOTE — ED Notes (Signed)
Floor states can not take pt with titratable Cardizem order.  Admitting MD states she was going to put titratable Cardizem order in

## 2011-04-22 NOTE — ED Provider Notes (Signed)
History     CSN: [id number]  Arrival date & time 04/22/11  0810   First MD Initiated Contact with Patient 04/22/11 0825      Chief Complaint  Patient presents with  . URI    (Consider location/radiation/quality/duration/timing/severity/associated sxs/prior treatment) HPI Comments: Im not feeling any better, feeling short of breath even to tie my shoes, for several weeks been getting SOB with even walking, lately been worsening" " still with cough and drainage", no cp, no dizziness  Patient is a 66 y.o. male presenting with URI. The history is provided by the patient. No language interpreter was used.  URI The primary symptoms include cough. Primary symptoms do not include fever, fatigue, headaches, sore throat, abdominal pain, nausea or vomiting. The current episode started 3 to 5 days ago. This is a new problem. The problem has been gradually worsening.  Symptoms associated with the illness include chills, sinus pressure, congestion and rhinorrhea.    Past Medical History  Diagnosis Date  . Sinusitis     A.  s/p sinus surgery in past  . Tobacco abuse     A.  47 yrs 1/2-1ppd - currently 1/2 ppd.  . Atrial fibrillation     A.  04/22/2011  . Pneumonia     A.  03/2011  . History of drug abuse     Smoked marijuana 1-2/ week up until about a year ago. Snorted and/or smoke cocaine  approximately 1/month until 5 years ago    Past Surgical History  Procedure Date  . Nasal septum surgery     30 YRS AGO    Family History  Problem Relation Age of Onset  . Lung cancer Mother     died from CA at age 43  . Lung cancer Father     died from CA at age 30  . Coronary artery disease Father 26    Pt reports his father had a "balloon placed through his arteries."    History  Substance Use Topics  . Smoking status: Current Everyday Smoker -- 0.5 packs/day for 45 years  . Smokeless tobacco: Not on file  . Alcohol Use: No      Review of Systems  Constitutional: Positive for  chills. Negative for fever, diaphoresis and fatigue.  HENT: Positive for congestion, rhinorrhea and sinus pressure. Negative for sore throat.   Respiratory: Positive for cough and shortness of breath.   Cardiovascular: Positive for palpitations. Negative for chest pain and leg swelling.  Gastrointestinal: Negative for nausea, vomiting and abdominal pain.  Neurological: Negative for dizziness, syncope and headaches.    Allergies  Review of patient's allergies indicates no known allergies.  Home Medications   Current Outpatient Rx  Name Route Sig Dispense Refill  . NYQUIL COLD & FLU PO Oral Take 30 mLs by mouth at bedtime as needed. For cold symptoms    . FLUTICASONE PROPIONATE 50 MCG/ACT NA SUSP Nasal Place 2 sprays into the nose daily. 16 g 2  . GUAIFENESIN-CODEINE 100-10 MG/5ML PO SYRP Oral Take 5 mLs by mouth every 6 (six) hours as needed for cough or congestion. 120 mL 0    BP 116/90  Pulse 111  Temp(Src) 97.7 F (36.5 C) (Oral)  Resp 30  SpO2 93%  Physical Exam  Vitals reviewed. Constitutional: He appears distressed.  Eyes: Conjunctivae are normal. No scleral icterus.  Neck: No JVD present.  Cardiovascular: An irregularly irregular rhythm present.  No extrasystoles are present. Exam reveals no gallop and no distant heart  sounds.   Pulmonary/Chest: Not tachypneic. No respiratory distress. He has decreased breath sounds. He has no rhonchi. He has no rales.    ED Course  Procedures (including critical care time)  Labs Reviewed  PRO B NATRIURETIC PEPTIDE - Abnormal; Notable for the following:    Pro B Natriuretic peptide (BNP) 2634.0 (*)    All other components within normal limits  POCT I-STAT, CHEM 8 - Abnormal; Notable for the following:    Glucose, Bld 142 (*)    All other components within normal limits  CBC  DIFFERENTIAL  I-STAT, CHEM 8   Dg Chest Port 1 View  04/22/2011  *RADIOLOGY REPORT*  Clinical Data: Productive cough  PORTABLE CHEST - 1 VIEW  Comparison:  None  Findings: The heart size is normal.  Atelectasis is identified within the left base.  There is a hazy opacity overlying the right base which may represent infiltrate or asymmetric edema.  The visualized osseous structures unremarkable.  IMPRESSION:  1.  Hazy opacity overlying the right base may represent infiltrate, asymmetric edema or posterior layering effusion. Suggest more definitive assessment with upright PA and lateral chest radiograph.  2.  Left base atelectasis.  Original Report Authenticated By: [name], M.D.     No diagnosis found.    MDM  A.F with RVR, ongoing respiratory symptoms, although describes ongoing exertional and worsening dyspnea. Transferred to ED for rate control, have gone up to 160, unresponsive to valsalva. BNP and basic labs drawn. Normotensive. Orders placed during the hospital encounter of 04/22/11  . EKG 12-LEAD  . EKG 12-LEAD             Jimmie Molly, MD 04/22/11 1739

## 2011-04-22 NOTE — ED Notes (Signed)
Patient spoke to son on the phone.  Shirt, hat, coat in belongings bag

## 2011-04-22 NOTE — ED Notes (Signed)
2112-01 Ready 

## 2011-04-23 DIAGNOSIS — J9601 Acute respiratory failure with hypoxia: Secondary | ICD-10-CM | POA: Diagnosis present

## 2011-04-23 DIAGNOSIS — I428 Other cardiomyopathies: Secondary | ICD-10-CM

## 2011-04-23 DIAGNOSIS — I5023 Acute on chronic systolic (congestive) heart failure: Secondary | ICD-10-CM | POA: Diagnosis present

## 2011-04-23 DIAGNOSIS — I517 Cardiomegaly: Secondary | ICD-10-CM

## 2011-04-23 DIAGNOSIS — Z720 Tobacco use: Secondary | ICD-10-CM | POA: Diagnosis present

## 2011-04-23 LAB — BASIC METABOLIC PANEL
BUN: 22 mg/dL (ref 6–23)
Creatinine, Ser: 1.22 mg/dL (ref 0.50–1.35)
GFR calc Af Amer: 71 mL/min — ABNORMAL LOW (ref >90)
GFR calc non Af Amer: 61 mL/min — ABNORMAL LOW (ref >90)

## 2011-04-23 LAB — CARDIAC PANEL(CRET KIN+CKTOT+MB+TROPI)
CK, MB: 2.8 ng/mL (ref 0.3–4.0)
CK, MB: 2.6 ng/mL (ref 0.3–4.0)
Relative Index: INVALID (ref 0.0–2.5)
Total CK: 79 U/L (ref 7–232)
Troponin I: <0.3 ng/mL (ref <0.30)
Troponin I: <0.3 ng/mL (ref <0.30)

## 2011-04-23 LAB — CBC
Hemoglobin: 14.3 g/dL (ref 13.0–17.0)
MCH: 29.9 pg (ref 26.0–34.0)
MCHC: 33.7 g/dL (ref 30.0–36.0)
MCV: 88.7 fL (ref 78.0–100.0)
Platelets: 334 10*3/uL (ref 150–400)
RBC: 4.78 MIL/uL (ref 4.22–5.81)

## 2011-04-23 LAB — PROTIME-INR: Prothrombin Time: 15.2 seconds (ref 11.6–15.2)

## 2011-04-23 MED ORDER — HEPARIN BOLUS VIA INFUSION
2500.0000 [IU] | Freq: Once | INTRAVENOUS | Status: AC
  Administered 2011-04-23: 2500 [IU] via INTRAVENOUS
  Filled 2011-04-23: qty 2500

## 2011-04-23 MED ORDER — LISINOPRIL 5 MG PO TABS
5.0000 mg | ORAL_TABLET | Freq: Every day | ORAL | Status: DC
  Administered 2011-04-23 – 2011-04-30 (×8): 5 mg via ORAL
  Filled 2011-04-23 (×8): qty 1

## 2011-04-23 MED ORDER — CARVEDILOL 6.25 MG PO TABS
6.2500 mg | ORAL_TABLET | Freq: Two times a day (BID) | ORAL | Status: DC
  Administered 2011-04-23 – 2011-04-24 (×2): 6.25 mg via ORAL
  Filled 2011-04-23 (×4): qty 1

## 2011-04-23 MED ORDER — FUROSEMIDE 10 MG/ML IJ SOLN
40.0000 mg | Freq: Two times a day (BID) | INTRAMUSCULAR | Status: DC
  Administered 2011-04-23 – 2011-04-24 (×2): 40 mg via INTRAVENOUS
  Filled 2011-04-23 (×4): qty 4

## 2011-04-23 MED ORDER — AMIODARONE HCL IN DEXTROSE 360-4.14 MG/200ML-% IV SOLN
0.5000 mg/min | INTRAVENOUS | Status: AC
  Administered 2011-04-24 – 2011-04-26 (×6): 0.5 mg/min via INTRAVENOUS
  Filled 2011-04-23 (×15): qty 200

## 2011-04-23 MED ORDER — WARFARIN VIDEO
Freq: Once | Status: AC
  Administered 2011-04-23: 21:00:00

## 2011-04-23 MED ORDER — COUMADIN BOOK
Freq: Once | Status: AC
  Administered 2011-04-23: 23:00:00
  Filled 2011-04-23 (×2): qty 1

## 2011-04-23 MED ORDER — BENZONATATE 100 MG PO CAPS
200.0000 mg | ORAL_CAPSULE | Freq: Three times a day (TID) | ORAL | Status: DC | PRN
  Administered 2011-04-23 (×2): 200 mg via ORAL
  Filled 2011-04-23 (×4): qty 2

## 2011-04-23 MED ORDER — WARFARIN SODIUM 7.5 MG PO TABS
7.5000 mg | ORAL_TABLET | Freq: Once | ORAL | Status: AC
  Administered 2011-04-23: 7.5 mg via ORAL
  Filled 2011-04-23 (×2): qty 1

## 2011-04-23 MED ORDER — AMIODARONE HCL IN DEXTROSE 360-4.14 MG/200ML-% IV SOLN
1.0000 mg/min | INTRAVENOUS | Status: AC
  Administered 2011-04-23 (×2): 1 mg/min via INTRAVENOUS
  Filled 2011-04-23 (×2): qty 200

## 2011-04-23 MED ORDER — AMIODARONE LOAD VIA INFUSION
150.0000 mg | Freq: Once | INTRAVENOUS | Status: AC
  Administered 2011-04-23: 150 mg via INTRAVENOUS
  Filled 2011-04-23: qty 83.34

## 2011-04-23 MED ORDER — SODIUM CHLORIDE 0.9 % IV SOLN
INTRAVENOUS | Status: DC
  Administered 2011-04-23: 10 mL/h via INTRAVENOUS
  Administered 2011-04-27: 250 mL via INTRAVENOUS

## 2011-04-23 NOTE — Consult Note (Signed)
Pt is a 1/2 ppd smoker who is in contemplation stage about quitting. He's cut back some on his smoking as well. Advised and encouraged pt to quit. Discussed available medical aids. Referred to 1-800 quit now for f/u and support. Discussed oral fixation substitutes, second hand smoke and in home smoking policy. Reviewed and gave pt Written education/contact information.

## 2011-04-23 NOTE — Progress Notes (Signed)
ANTICOAGULATION CONSULT NOTE - Follow Up Consult  Pharmacy Consult for heparin Indication: atrial fibrillation   Assessment: 78 yom presenting with CAP and afib with RVR. Patient continues to be in atrial fibrillation, with varied HR 40-100s. Currently on dilt gtt at 15mg /hr, heparin at 2250 units/hr, follow up heparin level still below goal at 0.26. No complications have been noted, appears to be running appropriately per nursing.  Goal of Therapy:  Heparin level 0.3-0.7 units/ml   Plan:  Increase IV heparin to 2450 units/hr Recheck heparin level tonight.  No Known Allergies  Patient Measurements: Height: 6\' 2"  (188 cm) Weight: 210 lb (95.255 kg) IBW/kg (Calculated) : 82.2  Heparin Dosing Weight: 95  Vital Signs: Temp: 97.5 F (36.4 C) (01/25 1139) Temp src: Oral (01/25 1139) BP: 118/91 mmHg (01/25 1100) Pulse Rate: 44  (01/25 0800)  Labs:  Basename 04/23/11 1028 04/23/11 1022 04/23/11 0500 04/23/11 0243 04/22/11 2205 04/22/11 2005 04/22/11 1030 04/22/11 1024 04/22/11 0855 04/22/11 0850  HGB -- -- 14.3 -- -- -- 15.0 -- -- --  HCT -- -- 42.4 -- -- -- 44.0 41.9 -- --  PLT -- -- 334 -- -- -- -- 313 -- 306  APTT -- -- 57* -- -- -- -- 38* -- --  LABPROT -- -- 15.2 -- -- -- -- 14.8 -- --  INR -- -- 1.18 -- -- -- -- 1.14 -- --  HEPARINUNFRC 0.26* -- 0.12* -- <0.10* -- -- -- -- --  CREATININE -- -- 1.22 -- -- -- 1.10 -- 1.10 --  CKTOTAL -- 79 -- 91 -- 83 -- -- -- --  CKMB -- 2.6 -- 2.8 -- 2.7 -- -- -- --  TROPONINI -- <0.30 -- <0.30 -- <0.30 -- -- -- --   Estimated Creatinine Clearance: 72.1 ml/min (by C-G formula based on Cr of 1.22).   Medications:  Infusions:    . sodium chloride 10 mL/hr (04/22/11 1847)  . diltiazem (CARDIZEM) infusion    . diltiazem (CARDIZEM) infusion 15 mg/hr (04/22/11 1733)  . heparin 2,250 Units/hr (04/23/11 0657)  . DISCONTD: sodium chloride Stopped (04/22/11 1459)  . DISCONTD: diltiazem (CARDIZEM) infusion Stopped (04/22/11 1412)    Darryl [name] 04/23/2011,12:54 PM

## 2011-04-23 NOTE — Progress Notes (Signed)
ANTICOAGULATION CONSULT NOTE - Initial Consult  Pharmacy Consult for Coumadin Indication: atrial fibrillation  No Known Allergies  Patient Measurements: Height: 6\' 2"  (188 cm) Weight: 210 lb (95.255 kg) IBW/kg (Calculated) : 82.2  Heparin Dosing Weight:   Vital Signs: Temp: 97.6 F (36.4 C) (01/25 1659) Temp src: Oral (01/25 1659) BP: 131/83 mmHg (01/25 1840) Pulse Rate: 72  (01/25 1700)  Labs:  Basename 04/23/11 1028 04/23/11 1022 04/23/11 0500 04/23/11 0243 04/22/11 2205 04/22/11 2005 04/22/11 1030 04/22/11 1024 04/22/11 0855 04/22/11 0850  HGB -- -- 14.3 -- -- -- 15.0 -- -- --  HCT -- -- 42.4 -- -- -- 44.0 41.9 -- --  PLT -- -- 334 -- -- -- -- 313 -- 306  APTT -- -- 57* -- -- -- -- 38* -- --  LABPROT -- -- 15.2 -- -- -- -- 14.8 -- --  INR -- -- 1.18 -- -- -- -- 1.14 -- --  HEPARINUNFRC 0.26* -- 0.12* -- <0.10* -- -- -- -- --  CREATININE -- -- 1.22 -- -- -- 1.10 -- 1.10 --  CKTOTAL -- 79 -- 91 -- 83 -- -- -- --  CKMB -- 2.6 -- 2.8 -- 2.7 -- -- -- --  TROPONINI -- <0.30 -- <0.30 -- <0.30 -- -- -- --   Estimated Creatinine Clearance: 72.1 ml/min (by C-G formula based on Cr of 1.22).  Medical History: Past Medical History  Diagnosis Date  . Sinusitis     A.  s/p sinus surgery in past  . Tobacco abuse     A.  47 yrs 1/2-1ppd - currently 1/2 ppd.  . Atrial fibrillation     A.  04/22/2011  . Pneumonia     A.  03/2011  . History of drug abuse     Smoked marijuana 1-2/ week up until about a year ago. Snorted and/or smoke cocaine  approximately 1/month until 5 years ago    Medications:  Prescriptions prior to admission  Medication Sig Dispense Refill  . DM-Doxylamine-Acetaminophen (NYQUIL COLD & FLU PO) Take 30 mLs by mouth at bedtime as needed. For cold symptoms      . fluticasone (FLONASE) 50 MCG/ACT nasal spray Place 2 sprays into the nose daily.  16 g  2  . guaiFENesin-codeine (ROBITUSSIN AC) 100-10 MG/5ML syrup Take 5 mLs by mouth every 6 (six) hours as needed for  cough or congestion.  120 mL  0    Assessment: 65yom on heparin and now to start Coumadin for Afib with RVR. Monitor INR closely as patient is also on amiodarone and azithromycin which can increase the INR.  - Baseline INR 1.18 - H/H and Plts wnl - No significant bleeding reported - Warfarin pts: 7  Goal of Therapy:  INR 2-3   Plan:  1. Coumadin 7.5mg  po x 1 today 2. Daily PT/INR 3. Coumadin educational book/video  Cleon Dew [phone] 04/23/2011,7:00 PM

## 2011-04-23 NOTE — Progress Notes (Signed)
[facility] [place] TEAM 8  Subjective: This is a 66 years old Caucasian man with no significant medical problems who was seen in urgent care clinic on Monday for postnasal drip and shortness of breath, where he was prescribed Flonase nasal spray and cough syrup. He continued to have worsening shortness of breath with minimal exertion associated with postnasal drip and coughing up yellowish mucus sputum production. He denies any chest pain, fever or chills. In the ED EKG showed atrial fibrillation with RVR and chest x-ray showed finding suggestive of pneumonia. He was started on Cardizem drip and given one dose of Rocephin and Zithromax.  At present the pt is resting comfortably.  He denies f/c, cp, n/v or abdom pain.  He states that he becomes sob w/ the slightest exertion, but denies sob at rest.    Objective: Weight change:   Intake/Output Summary (Last 24 hours) at 04/23/11 1403 Last data filed at 04/23/11 1100  Gross per 24 hour  Intake 1536.5 ml  Output    801 ml  Net  735.5 ml   Blood pressure 118/91, pulse 44, temperature 97.5 F (36.4 C), temperature source Oral, resp. rate 17, height 6\' 2"  (1.88 m), weight 95.255 kg (210 lb), SpO2 92.00%.  Physical Exam: General: No acute respiratory distress Lungs: Clear to auscultation bilaterally without wheezes or crackles Cardiovascular: Regular rate and irregular rhythm without murmur gallop or rub Abdomen: Nontender, nondistended, soft, bowel sounds positive, no rebound, no ascites, no appreciable mass Extremities: No significant cyanosis or clubbing, 2+ edema bilateral lower extremities  Lab Results:  Basename 04/23/11 0500 04/22/11 2205 04/22/11 1030 04/22/11 0855  NA 135 -- 140 140  K 4.2 -- 4.3 4.4  CL 101 -- 107 106  CO2 24 -- -- --  GLUCOSE 142* -- 113* 142*  BUN 22 -- 19 19  CREATININE 1.22 -- 1.10 1.10  CALCIUM 9.3 -- -- --  MG -- 2.1 -- --  PHOS -- -- -- --    Basename 04/23/11 0500 04/22/11 1030 04/22/11  1024 04/22/11 0850  WBC 10.2 -- 9.5 9.6  NEUTROABS -- -- 7.0 7.2  HGB 14.3 15.0 14.2 --  HCT 42.4 44.0 41.9 --  MCV 88.7 -- 89.1 88.8  PLT 334 -- 313 306    Basename 04/23/11 1022 04/23/11 0243 04/22/11 2005  CKTOTAL 79 91 83  CKMB 2.6 2.8 2.7  CKMBINDEX -- -- --  TROPONINI <0.30 <0.30 <0.30   Micro Results: Recent Results (from the past 240 hour(s))  MRSA PCR SCREENING     Status: Normal   Collection Time   04/22/11  6:33 PM      Component Value Range Status Comment   MRSA by PCR NEGATIVE  NEGATIVE  Final     Studies/Results: All recent x-ray/radiology reports have been reviewed in detail.   Medications: I have reviewed the patient's complete medication list.  Assessment/Plan:  Newly diagnosed Afib w/ RVR Requiring IV cardizem for rate control at present - see plan as per [facility]  Community acquired RLL PNA Continue emperic abx tx - no fever - no elevated WBC  Volume overload Exam suggests that the pt is significantly volume overloaded - I will gently diurese and follow   Tobacco abuse abstinence has been advised  Lonia Blood, MD [facility] Office  [phone] Pager [phone]  On-Call/Text Page:      [email or web address]      password [facility]

## 2011-04-23 NOTE — Progress Notes (Signed)
ANTICOAGULATION CONSULT NOTE   Pharmacy Consult for Heparin Indication: atrial fibrillation  No Known Allergies  Patient Measurements: Height: 6\' 2"  (188 cm) Weight: 210 lb (95.255 kg) IBW/kg (Calculated) : 82.2   Vital Signs: Temp: 97.8 F (36.6 C) (01/25 0400) Temp src: Oral (01/25 0400) BP: 108/77 mmHg (01/25 0300) Pulse Rate: 127  (01/25 0400)  Labs:  Basename 04/23/11 0500 04/23/11 0243 04/22/11 2205 04/22/11 2005 04/22/11 1030 04/22/11 1024 04/22/11 0855 04/22/11 0850  HGB 14.3 -- -- -- 15.0 -- -- --  HCT 42.4 -- -- -- 44.0 41.9 -- --  PLT 334 -- -- -- -- 313 -- 306  APTT 57* -- -- -- -- 38* -- --  LABPROT 15.2 -- -- -- -- 14.8 -- --  INR 1.18 -- -- -- -- 1.14 -- --  HEPARINUNFRC 0.12* -- <0.10* -- -- -- -- --  CREATININE -- -- -- -- 1.10 -- 1.10 --  CKTOTAL -- 91 -- 83 -- -- -- --  CKMB -- 2.8 -- 2.7 -- -- -- --  TROPONINI -- <0.30 -- <0.30 -- <0.30 -- --   Estimated Creatinine Clearance: 79.9 ml/min (by C-G formula based on Cr of 1.1).  Assessment: 66 year old male with Afib for Heparin.  Infusing appropriately overnight per RN  Goal of Therapy:  Heparin level 0.3-0.7 units/ml   Plan:  1) Rebolus Heparin 2500 units IV  2) Increase Heparin 2250 units / hr 3) Check heparin level in 6 hours. `   Kenidy Crossland, [employer] 04/23/2011,5:46 AM

## 2011-04-23 NOTE — Progress Notes (Signed)
Utilization Review Completed.Darryl [name] T1/25/2013   

## 2011-04-23 NOTE — Progress Notes (Signed)
Patient Name: Darryl [name]      SUBJECTIVE:strill short of breath with exertion  Past Medical History  Diagnosis Date  . Sinusitis     A.  s/p sinus surgery in past  . Tobacco abuse     A.  47 yrs 1/2-1ppd - currently 1/2 ppd.  . Atrial fibrillation     A.  04/22/2011  . Pneumonia     A.  03/2011  . History of drug abuse     Smoked marijuana 1-2/ week up until about a year ago. Snorted and/or smoke cocaine  approximately 1/month until 5 years ago    PHYSICAL EXAM Filed Vitals:   04/23/11 1139 04/23/11 1400 04/23/11 1500 04/23/11 1659  BP:  115/78 111/64   Pulse:   68   Temp: 97.5 F (36.4 C)   97.6 F (36.4 C)  TempSrc: Oral   Oral  Resp: 17  19   Height:      Weight:      SpO2: 92%  96%    Well developed and nourished in no acute distress HENT normal Neck supple with JVP-flat Carotids brisk and full without bruits rhonchi and decreased breathsounds Irregularly irregular rate and rhythm with  /rapid ventricular response, no murmurs or gallops Abd-soft with active BS without hepatomegaly No Clubbing cyanosis 1-2 edema Skin-warm and dry A & Oriented  Grossly normal sensory and motor function  TELEMETRY: Reviewed telemetry pt in AF the HR of 53 leisted on the snapshot are not valid:    Intake/Output Summary (Last 24 hours) at 04/23/11 1811 Last data filed at 04/23/11 1600  Gross per 24 hour  Intake   1902 ml  Output   1001 ml  Net    901 ml    LABS: Basic Metabolic Panel:  Lab 04/23/11 1610 04/22/11 2205 04/22/11 1030 04/22/11 0855  NA 135 -- 140 140  K 4.2 -- 4.3 4.4  CL 101 -- 107 106  CO2 24 -- -- --  GLUCOSE 142* -- 113* 142*  BUN 22 -- 19 19  CREATININE 1.22 -- 1.10 1.10  CALCIUM 9.3 -- -- --  MG -- 2.1 -- --  PHOS -- -- -- --   Cardiac Enzymes:  Basename 04/23/11 1022 04/23/11 0243 04/22/11 2005  CKTOTAL 79 91 83  CKMB 2.6 2.8 2.7  CKMBINDEX -- -- --  TROPONINI <0.30 <0.30 <0.30   CBC:  Lab 04/23/11 0500 04/22/11 1030 04/22/11 1024  04/22/11 0855 04/22/11 0850  WBC 10.2 -- 9.5 -- 9.6  NEUTROABS -- -- 7.0 -- 7.2  HGB 14.3 15.0 14.2 15.6 14.6  HCT 42.4 44.0 41.9 46.0 42.8  MCV 88.7 -- 89.1 -- 88.8  PLT 334 -- 313 -- 306   PROTIME:  Basename 04/23/11 0500 04/22/11 1024  LABPROT 15.2 14.8  INR 1.18 1.14     Basename 04/23/11 0500  TSH 2.303  T4TOTAL --  T3FREE --  THYROIDAB --   ECHO  - Left ventricle: The cavity size was normal. Wall thickness was increased in a pattern of mild LVH. Systolic function was severely reduced. The estimated ejection fraction was in the range of 20% to 25%. Diffuse hypokinesis. Doppler parameters are consistent with high ventricular filling pressure. - Left atrium: The atrium was mildly dilated. - Right ventricle: The cavity size was moderately dilated. - Right atrium: The atrium was mildly dilated.     ASSESSMENT AND PLAN:  Patient Active Hospital Problem List: Atrial fibrillation with RVR (04/22/2011)  Cardiomyopathy? Tachy mediated  **  Pneumonia (04/22/2011)   Acute respiratory failure with hypoxia (04/23/2011)   CHF (congestive heart failure) (04/23/2011)   Tobacco abuse (04/23/2011)  * The patient has striking LV dysfunction with CHF( will check BNP) and rapid AF  With no fever and Nl wbc  i wonder if this is all CHF and not pneumonia.   Will need to be more aggessive with rate control and prob need to pursue TEE-DCCV earlier raather than later and will use amiodarone for rate and rhythm control in the short term; i would like to get him off dilt given LV dysfunction He is on heparin and will add coumadin Needs ACE-I and betablocker for cardiomyopathy and agree with IV lasix Will need cath later but with global hypokinesis timing is not so urgent   Signed, Sherryl Manges MD  04/23/2011

## 2011-04-23 NOTE — Progress Notes (Signed)
ANTICOAGULATION CONSULT NOTE - Follow Up Consult  Pharmacy Consult for heparin Indication: atrial fibrillation  Assessment: 18 yom presenting with CAP and afib with RVR.  Heparin level at goal with heparin at 2450 units/hr.  No complications noted.  Goal of Therapy:  Heparin level 0.3-0.7 units/ml   Plan:  No change, continue heparin at 2450 units/hr. Recheck heparin level with am labs.  No Known Allergies  Patient Measurements: Height: 6\' 2"  (188 cm) Weight: 210 lb (95.255 kg) IBW/kg (Calculated) : 82.2  Heparin Dosing Weight: 95  Vital Signs: Temp: 97.6 F (36.4 C) (01/25 1659) Temp src: Oral (01/25 1659) BP: 124/76 mmHg (01/25 2000) Pulse Rate: 125  (01/25 2000)  Labs:  Basename 04/23/11 1940 04/23/11 1028 04/23/11 1022 04/23/11 0500 04/23/11 0243 04/22/11 2005 04/22/11 1030 04/22/11 1024 04/22/11 0855 04/22/11 0850  HGB -- -- -- 14.3 -- -- 15.0 -- -- --  HCT -- -- -- 42.4 -- -- 44.0 41.9 -- --  PLT -- -- -- 334 -- -- -- 313 -- 306  APTT -- -- -- 57* -- -- -- 38* -- --  LABPROT -- -- -- 15.2 -- -- -- 14.8 -- --  INR -- -- -- 1.18 -- -- -- 1.14 -- --  HEPARINUNFRC 0.57 0.26* -- 0.12* -- -- -- -- -- --  CREATININE -- -- -- 1.22 -- -- 1.10 -- 1.10 --  CKTOTAL -- -- 79 -- 91 83 -- -- -- --  CKMB -- -- 2.6 -- 2.8 2.7 -- -- -- --  TROPONINI -- -- <0.30 -- <0.30 <0.30 -- -- -- --   Estimated Creatinine Clearance: 72.1 ml/min (by C-G formula based on Cr of 1.22).   Medications:  Infusions:     . sodium chloride 10 mL/hr (04/22/11 1847)  . sodium chloride 10 mL/hr (04/23/11 1840)  . amiodarone (NEXTERONE PREMIX) 360 mg/200 mL dextrose 1 mg/min (04/23/11 1829)   And  . amiodarone (NEXTERONE PREMIX) 360 mg/200 mL dextrose    . diltiazem (CARDIZEM) infusion 15 mg/hr (04/23/11 1339)  . diltiazem (CARDIZEM) infusion 15 mg/hr (04/22/11 1733)  . heparin 2,450 Units/hr (04/23/11 1621)   Drusilla Kanner 04/23/2011,8:42 PM

## 2011-04-24 DIAGNOSIS — I5023 Acute on chronic systolic (congestive) heart failure: Secondary | ICD-10-CM

## 2011-04-24 LAB — PROTIME-INR
INR: 1.15 (ref 0.00–1.49)
Prothrombin Time: 14.9 s (ref 11.6–15.2)

## 2011-04-24 LAB — CBC
HCT: 40.7 % (ref 39.0–52.0)
Hemoglobin: 14.1 g/dL (ref 13.0–17.0)
MCH: 30.2 pg (ref 26.0–34.0)
MCHC: 34.6 g/dL (ref 30.0–36.0)
MCV: 87.2 fL (ref 78.0–100.0)

## 2011-04-24 LAB — BASIC METABOLIC PANEL
BUN: 23 mg/dL (ref 6–23)
Calcium: 9.2 mg/dL (ref 8.4–10.5)
Creatinine, Ser: 1.05 mg/dL (ref 0.50–1.35)
GFR calc non Af Amer: 74 mL/min — ABNORMAL LOW (ref >90)
Glucose, Bld: 141 mg/dL — ABNORMAL HIGH (ref 70–99)
Potassium: 3.8 mEq/L (ref 3.5–5.1)

## 2011-04-24 MED ORDER — METOPROLOL SUCCINATE ER 25 MG PO TB24
25.0000 mg | ORAL_TABLET | Freq: Two times a day (BID) | ORAL | Status: DC
  Administered 2011-04-24: 25 mg via ORAL
  Filled 2011-04-24 (×3): qty 1

## 2011-04-24 MED ORDER — DIGOXIN 125 MCG PO TABS
0.1250 mg | ORAL_TABLET | Freq: Every day | ORAL | Status: DC
  Administered 2011-04-24 – 2011-04-30 (×6): 0.125 mg via ORAL
  Filled 2011-04-24 (×7): qty 1

## 2011-04-24 MED ORDER — METOPROLOL SUCCINATE ER 25 MG PO TB24
25.0000 mg | ORAL_TABLET | Freq: Two times a day (BID) | ORAL | Status: DC
  Filled 2011-04-24 (×2): qty 1

## 2011-04-24 MED ORDER — WARFARIN SODIUM 7.5 MG PO TABS
7.5000 mg | ORAL_TABLET | Freq: Once | ORAL | Status: AC
  Administered 2011-04-24: 7.5 mg via ORAL
  Filled 2011-04-24: qty 1

## 2011-04-24 MED ORDER — POTASSIUM CHLORIDE 20 MEQ/15ML (10%) PO LIQD
40.0000 meq | Freq: Once | ORAL | Status: AC
  Administered 2011-04-24: 40 meq via ORAL
  Filled 2011-04-24: qty 30

## 2011-04-24 MED ORDER — FUROSEMIDE 10 MG/ML IJ SOLN
40.0000 mg | Freq: Three times a day (TID) | INTRAMUSCULAR | Status: DC
  Administered 2011-04-24 – 2011-04-25 (×3): 40 mg via INTRAVENOUS
  Filled 2011-04-24 (×6): qty 4

## 2011-04-24 NOTE — Progress Notes (Signed)
Patient ID: Darryl [name], male   DOB: September 13, 1945, 66 y.o.   MRN: [id number]    SUBJECTIVE: Short of breath walking to bathroom.  In atrial fibrillation with HR in the 100s-110s.     [name] amiodarone  150 mg Intravenous Once  . azithromycin  500 mg Intravenous Q24H  . carvedilol  6.25 mg Oral BID WC  . cefTRIAXone (ROCEPHIN)  IV  1 g Intravenous Q24H  . coumadin book   Does not apply Once  . fluticasone  2 spray Each Nare Daily  . furosemide  40 mg Intravenous Q12H  . lisinopril  5 mg Oral Daily  . nicotine  21 mg Transdermal Daily  . pneumococcal 23 valent vaccine  0.5 mL Intramuscular Tomorrow-1000  . warfarin  7.5 mg Oral Once  . warfarin  7.5 mg Oral ONCE-1800  . warfarin   Does not apply Once  amiodarone gtt Heparin gtt    Filed Vitals:   04/24/11 0600 04/24/11 0700 04/24/11 0800 04/24/11 0900  BP: 91/60 94/67 90/69  82/61  Pulse: 74 62 70 55  Temp:  97.6 F (36.4 C)    TempSrc:  Oral    Resp: 18 17 16 23   Height:      Weight:      SpO2: 92% 90% 92% 94%    Intake/Output Summary (Last 24 hours) at 04/24/11 0934 Last data filed at 04/24/11 0900  Gross per 24 hour  Intake 1967.1 ml  Output   2400 ml  Net -432.9 ml    LABS: Basic Metabolic Panel:  Basename 04/24/11 0600 04/23/11 0500 04/22/11 2205  NA 136 135 --  K 3.8 4.2 --  CL 100 101 --  CO2 22 24 --  GLUCOSE 141* 142* --  BUN 23 22 --  CREATININE 1.05 1.22 --  CALCIUM 9.2 9.3 --  MG 2.1 -- 2.1  PHOS -- -- --   Liver Function Tests: No results found for this basename: AST:2,ALT:2,ALKPHOS:2,BILITOT:2,PROT:2,ALBUMIN:2 in the last 72 hours No results found for this basename: LIPASE:2,AMYLASE:2 in the last 72 hours CBC:  Basename 04/24/11 0600 04/23/11 0500 04/22/11 1024 04/22/11 0850  WBC 10.0 10.2 -- --  NEUTROABS -- -- 7.0 7.2  HGB 14.1 14.3 -- --  HCT 40.7 42.4 -- --  MCV 87.2 88.7 -- --  PLT 297 334 -- --   Cardiac Enzymes:  Basename 04/23/11 1022 04/23/11 0243 04/22/11 2005  CKTOTAL 79  91 83  CKMB 2.6 2.8 2.7  CKMBINDEX -- -- --  TROPONINI <0.30 <0.30 <0.30   Thyroid Function Tests:  Basename 04/23/11 0500  TSH 2.303  T4TOTAL --  T3FREE --  THYROIDAB --   Anemia Panel: No results found for this basename: VITAMINB12,FOLATE,FERRITIN,TIBC,IRON,RETICCTPCT in the last 72 hours  BNP 2143  RADIOLOGY: Dg Chest Port 1 View  04/22/2011  *RADIOLOGY REPORT*  Clinical Data: Productive cough  PORTABLE CHEST - 1 VIEW  Comparison: None  Findings: The heart size is normal.  Atelectasis is identified within the left base.  There is a hazy opacity overlying the right base which may represent infiltrate or asymmetric edema.  The visualized osseous structures unremarkable.  IMPRESSION:  1.  Hazy opacity overlying the right base may represent infiltrate, asymmetric edema or posterior layering effusion. Suggest more definitive assessment with upright PA and lateral chest radiograph.  2.  Left base atelectasis.  Original Report Authenticated By: [name], M.D.    PHYSICAL EXAM General: NAD Neck: JVP 12 cm, no thyromegaly or thyroid nodule.  Lungs: Crackles at bases bilaterally.   CV: Nondisplaced PMI.  Heart irregular S1/S2, no S3/S4, no murmur.  1+ edema to knees bilaterally.   Abdomen: Soft, nontender, no hepatosplenomegaly, no distention.  Neurologic: Alert and oriented x 3.  Psych: Normal affect. Extremities: No clubbing or cyanosis.   ASSESSMENT AND PLAN:  66 yo with presented with dyspnea and was found to be in atrial fibrillation with RVR.  Echo showed EF 20-25% with diffuse hypokinesis.  He is also being treated for community acquired PNA. 1. CHF: Acute systolic CHF with EF 20-25%.  Cardiac enzymes negative.  ? Tachy-mediated cardiomyopathy given diffuse hypokinesis.  He is still volume overloaded.  - Increase Lasix to 40 mg IV q8 hrs - Given afib with RVR, will stop Coreg and use Toprol XL 25 mg bid.  - Continue lisinopril, will not titrate with soft BP.  - Will  eventually need left/right cath, but will get out of atrial fibrillation first.  This can be done down the road.  2. Atrial fibrillation: Still with mild RVR.  He is on amiodarone gtt and off diltiazem gtt with low EF.  Will add digoxin.  Change Coreg to Toprol XL.  ? Tachy-mediated cardiomyopathy.  TEE-guided DCCV on Monday.  He is on warfarin/heparin.  3. PNA: Possible PNA (but all may be pulmonary edema).  On abx per primary team.    [name] 04/24/2011 9:49 AM

## 2011-04-24 NOTE — Progress Notes (Signed)
ANTICOAGULATION CONSULT NOTE - Initial Consult  Pharmacy Consult for Coumadin and Heparin Indication: atrial fibrillation  No Known Allergies  Patient Measurements: Height: 6\' 2"  (188 cm) Weight: 210 lb (95.255 kg) IBW/kg (Calculated) : 82.2  Heparin Dosing Weight:   Vital Signs: Temp: 97.6 F (36.4 C) (01/26 0700) Temp src: Oral (01/26 0700) BP: 94/67 mmHg (01/26 0700) Pulse Rate: 62  (01/26 0700)  Labs:  Basename 04/24/11 0600 04/23/11 1940 04/23/11 1028 04/23/11 1022 04/23/11 0500 04/23/11 0243 04/22/11 2005 04/22/11 1030 04/22/11 1024  HGB 14.1 -- -- -- 14.3 -- -- -- --  HCT 40.7 -- -- -- 42.4 -- -- 44.0 --  PLT 297 -- -- -- 334 -- -- -- 313  APTT -- -- -- -- 57* -- -- -- 38*  LABPROT 14.9 -- -- -- 15.2 -- -- -- 14.8  INR 1.15 -- -- -- 1.18 -- -- -- 1.14  HEPARINUNFRC 0.54 0.57 0.26* -- -- -- -- -- --  CREATININE 1.05 -- -- -- 1.22 -- -- 1.10 --  CKTOTAL -- -- -- 79 -- 91 83 -- --  CKMB -- -- -- 2.6 -- 2.8 2.7 -- --  TROPONINI -- -- -- <0.30 -- <0.30 <0.30 -- --   Estimated Creatinine Clearance: 83.7 ml/min (by C-G formula based on Cr of 1.05).  Medical History: Past Medical History  Diagnosis Date  . Sinusitis     A.  s/p sinus surgery in past  . Tobacco abuse     A.  47 yrs 1/2-1ppd - currently 1/2 ppd.  . Atrial fibrillation     A.  04/22/2011  . Pneumonia     A.  03/2011  . History of drug abuse     Smoked marijuana 1-2/ week up until about a year ago. Snorted and/or smoke cocaine  approximately 1/month until 5 years ago    Medications:  Prescriptions prior to admission  Medication Sig Dispense Refill  . DM-Doxylamine-Acetaminophen (NYQUIL COLD & FLU PO) Take 30 mLs by mouth at bedtime as needed. For cold symptoms      . fluticasone (FLONASE) 50 MCG/ACT nasal spray Place 2 sprays into the nose daily.  16 g  2  . guaiFENesin-codeine (ROBITUSSIN AC) 100-10 MG/5ML syrup Take 5 mLs by mouth every 6 (six) hours as needed for cough or congestion.  120 mL  0     Assessment: 65yom on heparin and Coumadin for Afib with RVR. Heparin level at goal but INR still subtherapeutic. Monitor INR closely as patient is also on amiodarone and azithromycin which can increase the INR.  - Baseline INR 1.15 - H/H and Plts wnl - No significant bleeding reported  Goal of Therapy:  INR 2-3 Heparin level: 0.3-0.7 units/ml Plan:  1. Coumadin 7.5mg  po x 1 today 2. Continue Heparin at current rate at 2450 units/hr 3. Daily PT/INR and Heparin Levels     Janace Litten, PharmD [phone] 04/24/2011,8:01 AM

## 2011-04-24 NOTE — Progress Notes (Signed)
[facility] [place] TEAM 8  Subjective: This is a 66 years old Caucasian man with no significant medical problems who was seen in urgent care clinic on Monday for postnasal drip and shortness of breath, where he was prescribed Flonase nasal spray and cough syrup. He continued to have worsening shortness of breath with minimal exertion associated with postnasal drip and coughing up yellowish mucus sputum production. He denies any chest pain, fever or chills. In the ED EKG showed atrial fibrillation with RVR and chest x-ray showed finding suggestive of pneumonia. He was started on Cardizem drip and given one dose of Rocephin and Zithromax.  At present the pt is resting comfortably.  He denies f/c, cp, n/v or abdom pain.  He feels that his sob w/ exertion is without signif change today.    Objective: Weight change:   Intake/Output Summary (Last 24 hours) at 04/24/11 1431 Last data filed at 04/24/11 1415  Gross per 24 hour  Intake 2350.5 ml  Output   2501 ml  Net -150.5 ml   Blood pressure 92/61, pulse 103, temperature 96.7 F (35.9 C), temperature source Oral, resp. rate 19, height 6\' 2"  (1.88 m), weight 95.255 kg (210 lb), SpO2 94.00%.  Physical Exam: General: No acute respiratory distress Lungs: Clear to auscultation bilaterally without wheezes or crackles Cardiovascular: Regular rate and irregular rhythm without murmur gallop or rub Abdomen: Nontender, nondistended, soft, bowel sounds positive, no rebound, no ascites, no appreciable mass Extremities: No significant cyanosis or clubbing, 1+ edema bilateral lower extremities  Lab Results:  Basename 04/24/11 0600 04/23/11 0500 04/22/11 2205 04/22/11 1030  NA 136 135 -- 140  K 3.8 4.2 -- 4.3  CL 100 101 -- 107  CO2 22 24 -- --  GLUCOSE 141* 142* -- 113*  BUN 23 22 -- 19  CREATININE 1.05 1.22 -- 1.10  CALCIUM 9.2 9.3 -- --  MG 2.1 -- 2.1 --  PHOS -- -- -- --    Basename 04/24/11 0600 04/23/11 0500 04/22/11 1030  04/22/11 1024 04/22/11 0850  WBC 10.0 10.2 -- 9.5 --  NEUTROABS -- -- -- 7.0 7.2  HGB 14.1 14.3 15.0 -- --  HCT 40.7 42.4 44.0 -- --  MCV 87.2 88.7 -- 89.1 --  PLT 297 334 -- 313 --    Basename 04/23/11 1022 04/23/11 0243 04/22/11 2005  CKTOTAL 79 91 83  CKMB 2.6 2.8 2.7  CKMBINDEX -- -- --  TROPONINI <0.30 <0.30 <0.30   Micro Results: Recent Results (from the past 240 hour(s))  MRSA PCR SCREENING     Status: Normal   Collection Time   04/22/11  6:33 PM      Component Value Range Status Comment   MRSA by PCR NEGATIVE  NEGATIVE  Final     Studies/Results: All recent x-ray/radiology reports have been reviewed in detail.   Medications: I have reviewed the patient's complete medication list.  Assessment/Plan:  Newly diagnosed Afib w/ RVR Still requiring IV meds for rate control at present - see plan as per [facility] - amio and coumadin have been added to his regimen - plan is for DCCV on Monday   Newly diagnoses severe acute systolic CHF/LV dysfxn EF 20% via echo in a diffuse hypokinetic pattern - see Cards note for plan  ?Community acquired RLL PNA no fever - no elevated WBC - I agree with Cards that this likely represents pulm edema rather than a true infectious process - will repeat CXR in AM and consider d/c  of abx at that time unless compelling evidence of actual infiltrate/clinical sx of PNA are appreciable  Volume overload Exam suggests that the pt remains significantly volume overloaded - cont to diurese and follow   Tobacco abuse abstinence has been advised  Lonia Blood, MD [facility] Office  [phone] Pager [phone]  On-Call/Text Page:      [email or web address]      password [facility]

## 2011-04-25 ENCOUNTER — Inpatient Hospital Stay (HOSPITAL_COMMUNITY): Payer: BC Managed Care – PPO

## 2011-04-25 LAB — BASIC METABOLIC PANEL
BUN: 24 mg/dL — ABNORMAL HIGH (ref 6–23)
CO2: 23 mEq/L (ref 19–32)
Chloride: 98 mEq/L (ref 96–112)
Creatinine, Ser: 1.16 mg/dL (ref 0.50–1.35)

## 2011-04-25 LAB — CBC
HCT: 39.9 % (ref 39.0–52.0)
Hemoglobin: 13.7 g/dL (ref 13.0–17.0)
MCH: 30 pg (ref 26.0–34.0)
MCHC: 34.3 g/dL (ref 30.0–36.0)

## 2011-04-25 LAB — PROTIME-INR: INR: 1.37 (ref 0.00–1.49)

## 2011-04-25 MED ORDER — SODIUM CHLORIDE 0.9 % IV SOLN: 250.0000 mL | INTRAVENOUS | Status: DC | PRN

## 2011-04-25 MED ORDER — FUROSEMIDE 10 MG/ML IJ SOLN
60.0000 mg | Freq: Three times a day (TID) | INTRAMUSCULAR | Status: DC
  Administered 2011-04-25 – 2011-04-29 (×11): 60 mg via INTRAVENOUS
  Filled 2011-04-25 (×15): qty 6

## 2011-04-25 MED ORDER — METOPROLOL SUCCINATE ER 50 MG PO TB24
50.0000 mg | ORAL_TABLET | Freq: Two times a day (BID) | ORAL | Status: DC
  Administered 2011-04-25 – 2011-04-30 (×10): 50 mg via ORAL
  Filled 2011-04-25 (×12): qty 1

## 2011-04-25 MED ORDER — HYDROCORTISONE 1 % EX CREA
1.0000 "application " | TOPICAL_CREAM | Freq: Three times a day (TID) | CUTANEOUS | Status: DC | PRN
  Filled 2011-04-25: qty 28

## 2011-04-25 MED ORDER — SODIUM CHLORIDE 0.45 % IV SOLN
INTRAVENOUS | Status: DC
  Administered 2011-04-26: 10 mL/h via INTRAVENOUS

## 2011-04-25 MED ORDER — ALUM & MAG HYDROXIDE-SIMETH 200-200-20 MG/5ML PO SUSP: 15.0000 mL | ORAL | Status: DC | PRN

## 2011-04-25 MED ORDER — SODIUM CHLORIDE 0.9 % IJ SOLN: 3.0000 mL | INTRAMUSCULAR | Status: DC | PRN

## 2011-04-25 MED ORDER — MAGNESIUM HYDROXIDE 400 MG/5ML PO SUSP: 30.0000 mL | Freq: Every day | ORAL | Status: DC | PRN

## 2011-04-25 MED ORDER — POTASSIUM CHLORIDE 20 MEQ/15ML (10%) PO LIQD
40.0000 meq | Freq: Once | ORAL | Status: AC
  Administered 2011-04-25: 40 meq via ORAL
  Filled 2011-04-25: qty 30

## 2011-04-25 MED ORDER — PRAMOXINE HCL 1 % RE OINT
1.0000 "application " | TOPICAL_OINTMENT | Freq: Three times a day (TID) | RECTAL | Status: DC | PRN
  Filled 2011-04-25: qty 30

## 2011-04-25 MED ORDER — WARFARIN SODIUM 7.5 MG PO TABS
7.5000 mg | ORAL_TABLET | Freq: Once | ORAL | Status: AC
  Administered 2011-04-25: 7.5 mg via ORAL
  Filled 2011-04-25: qty 1

## 2011-04-25 MED ORDER — SODIUM CHLORIDE 0.9 % IJ SOLN
3.0000 mL | Freq: Two times a day (BID) | INTRAMUSCULAR | Status: DC
  Administered 2011-04-25 – 2011-04-26 (×2): 3 mL via INTRAVENOUS

## 2011-04-25 NOTE — Progress Notes (Signed)
Patient ID: Darryl [name], male   DOB: 01/13/1946, 66 y.o.   MRN: [id number]     SUBJECTIVE: Breathing is better with ambulation.  In atrial fibrillation with HR in the 100s-110s.  I/Os net positive but patient says he was flushing his urine.  Apparently his UOP was quite vigorous.      [name] azithromycin  500 mg Intravenous Q24H  . cefTRIAXone (ROCEPHIN)  IV  1 g Intravenous Q24H  . digoxin  0.125 mg Oral Daily  . fluticasone  2 spray Each Nare Daily  . furosemide  40 mg Intravenous Q8H  . lisinopril  5 mg Oral Daily  . metoprolol succinate  50 mg Oral BID  . nicotine  21 mg Transdermal Daily  . potassium chloride  40 mEq Oral Once  . potassium chloride  40 mEq Oral Once  . warfarin  7.5 mg Oral ONCE-1800  . warfarin  7.5 mg Oral ONCE-1800  . DISCONTD: carvedilol  6.25 mg Oral BID WC  . DISCONTD: furosemide  40 mg Intravenous Q12H  . DISCONTD: metoprolol succinate  25 mg Oral BID  . DISCONTD: metoprolol succinate  25 mg Oral BID  amiodarone gtt Heparin gtt    Filed Vitals:   04/24/11 2337 04/25/11 0400 04/25/11 0726 04/25/11 0728  BP: 88/57 115/48  91/78  Pulse:      Temp: 97.4 F (36.3 C) 98.4 F (36.9 C) 97.6 F (36.4 C)   TempSrc: Oral Oral Oral   Resp: 18     Height:      Weight:      SpO2: 95% 94% 95%     Intake/Output Summary (Last 24 hours) at 04/25/11 0834 Last data filed at 04/25/11 0800  Gross per 24 hour  Intake 2392.74 ml  Output   1501 ml  Net 891.74 ml    LABS: Basic Metabolic Panel:  Basename 04/25/11 0510 04/24/11 0600 04/22/11 2205  NA 135 136 --  K 3.8 3.8 --  CL 98 100 --  CO2 23 22 --  GLUCOSE 118* 141* --  BUN 24* 23 --  CREATININE 1.16 1.05 --  CALCIUM 9.1 9.2 --  MG -- 2.1 2.1  PHOS -- -- --   Liver Function Tests: No results found for this basename: AST:2,ALT:2,ALKPHOS:2,BILITOT:2,PROT:2,ALBUMIN:2 in the last 72 hours No results found for this basename: LIPASE:2,AMYLASE:2 in the last 72 hours CBC:  Basename 04/25/11 0510  04/24/11 0600 04/22/11 1024 04/22/11 0850  WBC 9.8 10.0 -- --  NEUTROABS -- -- 7.0 7.2  HGB 13.7 14.1 -- --  HCT 39.9 40.7 -- --  MCV 87.5 87.2 -- --  PLT 285 297 -- --   Cardiac Enzymes:  Basename 04/23/11 1022 04/23/11 0243 04/22/11 2005  CKTOTAL 79 91 83  CKMB 2.6 2.8 2.7  CKMBINDEX -- -- --  TROPONINI <0.30 <0.30 <0.30   Thyroid Function Tests:  Basename 04/23/11 0500  TSH 2.303  T4TOTAL --  T3FREE --  THYROIDAB --   Anemia Panel: No results found for this basename: VITAMINB12,FOLATE,FERRITIN,TIBC,IRON,RETICCTPCT in the last 72 hours  BNP 2143  RADIOLOGY: Dg Chest Port 1 View  04/22/2011  *RADIOLOGY REPORT*  Clinical Data: Productive cough  PORTABLE CHEST - 1 VIEW  Comparison: None  Findings: The heart size is normal.  Atelectasis is identified within the left base.  There is a hazy opacity overlying the right base which may represent infiltrate or asymmetric edema.  The visualized osseous structures unremarkable.  IMPRESSION:  1.  Hazy opacity overlying the right  base may represent infiltrate, asymmetric edema or posterior layering effusion. Suggest more definitive assessment with upright PA and lateral chest radiograph.  2.  Left base atelectasis.  Original Report Authenticated By: [name], M.D.    PHYSICAL EXAM General: NAD Neck: JVP 12 cm, no thyromegaly or thyroid nodule.  Lungs: Crackles at bases bilaterally.   CV: Nondisplaced PMI.  Heart irregular S1/S2, no S3/S4, no murmur.  1+ edema 1/2 to knees bilaterally.   Abdomen: Soft, nontender, no hepatosplenomegaly, no distention.  Neurologic: Alert and oriented x 3.  Psych: Normal affect. Extremities: No clubbing or cyanosis.   ASSESSMENT AND PLAN:  66 yo with presented with dyspnea and was found to be in atrial fibrillation with RVR.  Echo showed EF 20-25% with diffuse hypokinesis.  He is also being treated for community acquired PNA. 1. CHF: Acute systolic CHF with EF 20-25%.  Cardiac enzymes negative.   ? Tachy-mediated cardiomyopathy given diffuse hypokinesis.  He is still volume overloaded. Difficult to tell how well he diuresed as I/Os not well-recorded, but his weight is not down compared to prior.  - Increase Lasix to 60 mg IV q8 hrs - Increase Toprol XL to 50 mg bid.  - Continue digoxin and lisinopril.  - Will eventually need left/right cath, but will get out of atrial fibrillation first.  This can be done down the road.  2. Atrial fibrillation: Still with mild RVR.  He is on amiodarone gtt and off diltiazem gtt with low EF.  Continue digoxin.  Increase Toprol XL to 50 mg bid.  ? Tachy-mediated cardiomyopathy.  TEE-guided DCCV on Monday.  He is on warfarin/heparin.  3. PNA: Possible PNA (but all may be pulmonary edema).  On abx per primary team.    Darryl [name] 04/25/2011 8:34 AM

## 2011-04-25 NOTE — Progress Notes (Signed)
ANTICOAGULATION CONSULT NOTE - Initial Consult  Pharmacy Consult for Coumadin and Heparin Indication: atrial fibrillation  No Known Allergies  Patient Measurements: Height: 6\' 2"  (188 cm) Weight: 210 lb (95.255 kg) IBW/kg (Calculated) : 82.2  Heparin Dosing Weight:   Vital Signs: Temp: 97.6 F (36.4 C) (01/27 0726) Temp src: Oral (01/27 0726) BP: 91/78 mmHg (01/27 0728)  Labs:  Basename 04/25/11 0510 04/24/11 0600 04/23/11 1940 04/23/11 1022 04/23/11 0500 04/23/11 0243 04/22/11 2005 04/22/11 1024  HGB 13.7 14.1 -- -- -- -- -- --  HCT 39.9 40.7 -- -- 42.4 -- -- --  PLT 285 297 -- -- 334 -- -- --  APTT -- -- -- -- 57* -- -- 38*  LABPROT 17.1* 14.9 -- -- 15.2 -- -- --  INR 1.37 1.15 -- -- 1.18 -- -- --  HEPARINUNFRC 0.53 0.54 0.57 -- -- -- -- --  CREATININE 1.16 1.05 -- -- 1.22 -- -- --  CKTOTAL -- -- -- 79 -- 91 83 --  CKMB -- -- -- 2.6 -- 2.8 2.7 --  TROPONINI -- -- -- <0.30 -- <0.30 <0.30 --   Estimated Creatinine Clearance: 75.8 ml/min (by C-G formula based on Cr of 1.16).  Medical History: Past Medical History  Diagnosis Date  . Sinusitis     A.  s/p sinus surgery in past  . Tobacco abuse     A.  47 yrs 1/2-1ppd - currently 1/2 ppd.  . Atrial fibrillation     A.  04/22/2011  . Pneumonia     A.  03/2011  . History of drug abuse     Smoked marijuana 1-2/ week up until about a year ago. Snorted and/or smoke cocaine  approximately 1/month until 5 years ago    Medications:  Prescriptions prior to admission  Medication Sig Dispense Refill  . DM-Doxylamine-Acetaminophen (NYQUIL COLD & FLU PO) Take 30 mLs by mouth at bedtime as needed. For cold symptoms      . fluticasone (FLONASE) 50 MCG/ACT nasal spray Place 2 sprays into the nose daily.  16 g  2  . guaiFENesin-codeine (ROBITUSSIN AC) 100-10 MG/5ML syrup Take 5 mLs by mouth every 6 (six) hours as needed for cough or congestion.  120 mL  0    Assessment: 65yom on heparin and Coumadin for Afib with RVR. Heparin  level at goal but INR still subtherapeutic. Monitor INR closely as patient is also on amiodarone and azithromycin which can increase the INR. Plan for possible DVVC on Monday  - Baseline INR 1.15 - H/H and Plts WNL and stable - No significant bleeding reported  Goal of Therapy:  INR 2-3 Heparin level: 0.3-0.7 units/ml Plan:  1. Coumadin 7.5mg  po x 1 today 2. Continue Heparin at current rate at 2450 units/hr 3. Daily PT/INR and Heparin Levels/CBC     Janace Litten, PharmD [phone] 04/25/2011,8:06 AM

## 2011-04-25 NOTE — Progress Notes (Signed)
[facility] [place] TEAM 8  Subjective: This is a 66 years old Caucasian man with no significant medical problems who was seen in urgent care clinic on Monday for postnasal drip and shortness of breath, where he was prescribed Flonase nasal spray and cough syrup. He continued to have worsening shortness of breath with minimal exertion associated with postnasal drip and coughing up yellowish mucus sputum production. He denies any chest pain, fever or chills. In the ED EKG showed atrial fibrillation with RVR and chest x-ray showed finding suggestive of pneumonia. He was started on Cardizem drip and given one dose of Rocephin and Zithromax.  At present the pt is resting comfortably in a chair at bedside.  He denies f/c, cp, n/v or abdom pain.     Objective: Weight change:   Intake/Output Summary (Last 24 hours) at 04/25/11 0914 Last data filed at 04/25/11 0800  Gross per 24 hour  Intake 2346.54 ml  Output   1501 ml  Net 845.54 ml   Blood pressure 91/78, pulse 107, temperature 97.6 F (36.4 C), temperature source Oral, resp. rate 18, height 6\' 2"  (1.88 m), weight 119.8 kg (264 lb 1.8 oz), SpO2 95.00%.  Physical Exam: General: No acute respiratory distress Lungs: Clear to auscultation bilaterally without wheezes or crackles Cardiovascular: Regular rate and irregular rhythm without murmur gallop or rub Abdomen: Nontender, nondistended, soft, bowel sounds positive, no rebound, no ascites, no appreciable mass Extremities: No significant cyanosis or clubbing, 1+ edema bilateral lower extremities  Lab Results:  Basename 04/25/11 0510 04/24/11 0600 04/23/11 0500 04/22/11 2205  NA 135 136 135 --  K 3.8 3.8 4.2 --  CL 98 100 101 --  CO2 23 22 24  --  GLUCOSE 118* 141* 142* --  BUN 24* 23 22 --  CREATININE 1.16 1.05 1.22 --  CALCIUM 9.1 9.2 9.3 --  MG -- 2.1 -- 2.1  PHOS -- -- -- --    Basename 04/25/11 0510 04/24/11 0600 04/23/11 0500 04/22/11 1024  WBC 9.8 10.0 10.2 --    NEUTROABS -- -- -- 7.0  HGB 13.7 14.1 14.3 --  HCT 39.9 40.7 42.4 --  MCV 87.5 87.2 88.7 --  PLT 285 297 334 --    Basename 04/23/11 1022 04/23/11 0243 04/22/11 2005  CKTOTAL 79 91 83  CKMB 2.6 2.8 2.7  CKMBINDEX -- -- --  TROPONINI <0.30 <0.30 <0.30   Micro Results: Recent Results (from the past 240 hour(s))  MRSA PCR SCREENING     Status: Normal   Collection Time   04/22/11  6:33 PM      Component Value Range Status Comment   MRSA by PCR NEGATIVE  NEGATIVE  Final     Studies/Results: All recent x-ray/radiology reports have been reviewed in detail.   Medications: I have reviewed the patient's complete medication list.  Assessment/Plan:  Newly diagnosed Afib w/ RVR Still requiring IV meds for rate control at present - see plan as per [facility] - amio and coumadin have been added to his regimen - plan is for DCCV on Monday   Newly diagnoses severe acute systolic CHF/LV dysfxn EF 20% via echo in a diffuse hypokinetic pattern - see Cards note for plan  ?Community acquired RLL PNA no fever - no elevated WBC - I agree with Cards that this likely represents pulm edema rather than a true infectious process - f/u CXR not convincing for a focal infiltrate - clinically, I do not feel that this patient has pneumonia -  I will d/c his abx tx  Volume overload Exam suggests that the pt remains significantly volume overloaded - cont to diurese and follow - Cards is titrating meds  Tobacco abuse abstinence has been advised  Mild hyerglycemia This lis likely due to IV dextrose infusing with his IV meds - simply need to follow trend as all IV meds are discontinued  Disposition At this point, Cardiology is addressing all of Mr. Heavin active problems.  I have spoken w/ Dr. Shirlee Latch, who has agreed to assume the attending role for this patient. TR[facility] will sign off.  Please feel free to call us for questions as needed.  Lonia Blood, MD [facility] Office   [phone] Pager [phone]  On-Call/Text Page:      [email or web address]      password [facility]

## 2011-04-26 ENCOUNTER — Encounter (HOSPITAL_COMMUNITY): Payer: Self-pay | Admitting: Anesthesiology

## 2011-04-26 ENCOUNTER — Encounter (HOSPITAL_COMMUNITY): Payer: Self-pay | Admitting: Internal Medicine

## 2011-04-26 ENCOUNTER — Inpatient Hospital Stay (HOSPITAL_COMMUNITY): Payer: BC Managed Care – PPO | Admitting: Anesthesiology

## 2011-04-26 ENCOUNTER — Encounter (HOSPITAL_COMMUNITY): Admission: EM | Disposition: A | Discharge status: Home / Self Care | Payer: Self-pay | Source: Home / Self Care

## 2011-04-26 DIAGNOSIS — I4891 Unspecified atrial fibrillation: Secondary | ICD-10-CM

## 2011-04-26 HISTORY — PX: CARDIOVERSION: SHX1299

## 2011-04-26 HISTORY — PX: TEE WITHOUT CARDIOVERSION: SHX5443

## 2011-04-26 LAB — BASIC METABOLIC PANEL
BUN: 21 mg/dL (ref 6–23)
GFR calc Af Amer: 68 mL/min — ABNORMAL LOW (ref >90)
GFR calc non Af Amer: 58 mL/min — ABNORMAL LOW (ref >90)
Potassium: 3.9 mEq/L (ref 3.5–5.1)

## 2011-04-26 LAB — HEPARIN LEVEL (UNFRACTIONATED): Heparin Unfractionated: 0.55 IU/mL (ref 0.30–0.70)

## 2011-04-26 LAB — CBC
MCH: 29.6 pg (ref 26.0–34.0)
Platelets: 265 10*3/uL (ref 150–400)
RBC: 4.66 MIL/uL (ref 4.22–5.81)
WBC: 8.9 10*3/uL (ref 4.0–10.5)

## 2011-04-26 LAB — PROTIME-INR
INR: 1.63 — ABNORMAL HIGH (ref 0.00–1.49)
Prothrombin Time: 19.6 seconds — ABNORMAL HIGH (ref 11.6–15.2)

## 2011-04-26 SURGERY — ECHOCARDIOGRAM, TRANSESOPHAGEAL
Anesthesia: Moderate Sedation

## 2011-04-26 MED ORDER — BUTAMBEN-TETRACAINE-BENZOCAINE 2-2-14 % EX AERO
INHALATION_SPRAY | CUTANEOUS | Status: DC | PRN
  Administered 2011-04-26: 2 via TOPICAL

## 2011-04-26 MED ORDER — MIDAZOLAM HCL 10 MG/2ML IJ SOLN: 10.0000 mg | Freq: Once | INTRAMUSCULAR | Status: DC

## 2011-04-26 MED ORDER — FENTANYL CITRATE 0.05 MG/ML IJ SOLN
INTRAMUSCULAR | Status: AC
  Filled 2011-04-26: qty 2

## 2011-04-26 MED ORDER — ETOMIDATE 2 MG/ML IV SOLN
INTRAVENOUS | Status: DC | PRN
  Administered 2011-04-26: 16 mg via INTRAVENOUS

## 2011-04-26 MED ORDER — MIDAZOLAM HCL 10 MG/2ML IJ SOLN
INTRAMUSCULAR | Status: AC
  Filled 2011-04-26: qty 2

## 2011-04-26 MED ORDER — DIPHENHYDRAMINE HCL 50 MG/ML IJ SOLN
INTRAMUSCULAR | Status: AC
  Filled 2011-04-26: qty 1

## 2011-04-26 MED ORDER — AMIODARONE HCL 200 MG PO TABS
400.0000 mg | ORAL_TABLET | Freq: Two times a day (BID) | ORAL | Status: DC
  Administered 2011-04-26 – 2011-04-28 (×4): 400 mg via ORAL
  Filled 2011-04-26 (×5): qty 2

## 2011-04-26 MED ORDER — FENTANYL CITRATE 0.05 MG/ML IJ SOLN: 250.0000 ug | Freq: Once | INTRAMUSCULAR | Status: DC

## 2011-04-26 MED ORDER — WARFARIN SODIUM 7.5 MG PO TABS
7.5000 mg | ORAL_TABLET | Freq: Once | ORAL | Status: AC
  Administered 2011-04-26: 7.5 mg via ORAL
  Filled 2011-04-26: qty 1

## 2011-04-26 MED ORDER — FENTANYL CITRATE 0.05 MG/ML IJ SOLN
INTRAMUSCULAR | Status: DC | PRN
  Administered 2011-04-26 (×2): 50 ug via INTRAVENOUS

## 2011-04-26 MED ORDER — BENZOCAINE 20 % MT SOLN: 1.0000 "application " | OROMUCOSAL | Status: DC | PRN

## 2011-04-26 MED ORDER — MIDAZOLAM HCL 10 MG/2ML IJ SOLN
INTRAMUSCULAR | Status: DC | PRN
  Administered 2011-04-26 (×2): 1 mg via INTRAVENOUS
  Administered 2011-04-26: 2 mg via INTRAVENOUS
  Administered 2011-04-26: 1 mg via INTRAVENOUS

## 2011-04-26 NOTE — Transfer of Care (Signed)
Immediate Anesthesia Transfer of Care Note  Patient: Darryl [name]  Procedure(s) Performed:  TRANSESOPHAGEAL ECHOCARDIOGRAM (TEE); CARDIOVERSION  Patient Location: PACU  Anesthesia Type: General  Level of Consciousness: awake  Airway & Oxygen Therapy: Patient Spontanous Breathing and Patient connected to face mask oxygen  Post-op Assessment: Report given to PACU RN and Post -op Vital signs reviewed and stable  Post vital signs: stable  Complications: No apparent anesthesia complications

## 2011-04-26 NOTE — Anesthesia Postprocedure Evaluation (Signed)
  Anesthesia Post-op Note  Patient: Darryl [name]  Procedure(s) Performed:  TRANSESOPHAGEAL ECHOCARDIOGRAM (TEE); CARDIOVERSION  Patient Location: PACU  Anesthesia Type: General  Level of Consciousness: awake  Airway and Oxygen Therapy: Patient Spontanous Breathing and Patient connected to face mask oxygen  Post-op Pain: none  Post-op Assessment: Post-op Vital signs reviewed, Patient's Cardiovascular Status Stable, Respiratory Function Stable, Patent Airway and No signs of Nausea or vomiting  Post-op Vital Signs: stable  Complications: No apparent anesthesia complications

## 2011-04-26 NOTE — Op Note (Signed)
    Transesophageal Echocardiogram Note  Darryl [name] [id number] 01-Mar-1946  Procedure: Transesophageal Echocardiogram Indications: A-Fib  Procedure Details Consent: Obtained Time Out: Verified patient identification, verified procedure,Performed site/side was marked, verified correct patient position, special equipment/implants available, Radiology Safety Procedures followed,  medications/allergies/relevent history reviewed, required imaging and test results available.    Medications: Fentanyl: 100 mcg MI Versed: 5 mg IV  Left Ventrical:  Severe LV dysfunction,  EF 20%  Mitral Valve: mild-moderate MR, mo MS  Aortic Valve: 3 leaflet valve, no AS or AI  Tricuspid Valve: trivial TR  Pulmonic Valve: normal  Left Atrium/ Left atrial appendage: no LAA thrombus, + spontaneous contrast ( smoke)  Atrial septum: no ASD or PFO  Aorta: normal   Complications: No apparent complications Patient did tolerate procedure well.   No thrombus.  Pt is OK for cardioversion.   Procedure: DC Cardioversion Indications: A-Fib  Procedure Details Consent: Obtained Time Out: Verified patient identification, verified procedure, site/side was marked, verified correct patient position, special equipment/implants available, Radiology Safety Procedures followed,  medications/allergies/relevent history reviewed, required imaging and test results available.  Performed  The patient has been on adequate anticoagulation ( IV heparin) and Amiodarone. TEE verified absence of LAA thrombus. The patient received IV Etomidate 16 mg for sedation.  Synchronous cardioversion was performed at 200 joules X 2.  The cardioversion was unsuccessful.  He remained in A-Fib.    Complications: No apparent complications Patient did tolerate procedure well.   Vesta Mixer, Montez Hageman., MD, [facility] 04/26/2011, 2:10 PM

## 2011-04-26 NOTE — Brief Op Note (Signed)
    Transesophageal Echocardiogram Note  Darryl [name] [id number] 1946-02-23  Procedure: Transesophageal Echocardiogram Indications: A-Fib  Procedure Details Consent: Obtained Time Out: Verified patient identification, verified procedure,Performed site/side was marked, verified correct patient position, special equipment/implants available, Radiology Safety Procedures followed,  medications/allergies/relevent history reviewed, required imaging and test results available.    Medications: Fentanyl: 100 mcg MI Versed: 5 mg IV  Left Ventrical:  Severe LV dysfunction,  EF 20%  Mitral Valve: mild-moderate MR, mo MS  Aortic Valve: 3 leaflet valve, no AS or AI  Tricuspid Valve: trivial TR  Pulmonic Valve: normal  Left Atrium/ Left atrial appendage: no LAA thrombus, + spontaneous contrast ( smoke)  Atrial septum: no ASD or PFO  Aorta: normal   Complications: No apparent complications Patient did tolerate procedure well.   No thrombus.  Pt is OK for cardioversion.   Procedure: DC Cardioversion Indications: A-Fib  Procedure Details Consent: Obtained Time Out: Verified patient identification, verified procedure, site/side was marked, verified correct patient position, special equipment/implants available, Radiology Safety Procedures followed,  medications/allergies/relevent history reviewed, required imaging and test results available.  Performed  The patient has been on adequate anticoagulation ( IV heparin) and Amiodarone. TEE verified absence of LAA thrombus. The patient received IV Etomidate 16 mg for sedation.  Synchronous cardioversion was performed at 200 joules X 2.  The cardioversion was unsuccessful.  He remained in A-Fib.    Complications: No apparent complications Patient did tolerate procedure well.   Vesta Mixer, Montez Hageman., MD, [facility] 04/26/2011, 1:43 PM       Vesta Mixer, Montez Hageman., MD, [facility] 04/26/2011, 1:40 PM

## 2011-04-26 NOTE — Anesthesia Preprocedure Evaluation (Addendum)
Anesthesia Evaluation  Patient identified by MRN, date of birth, ID band Patient awake  General Assessment Comment:Sedated from TEE.  Reviewed: Allergy & Precautions, H&P , NPO status , Patient's Chart, lab work & pertinent test results  Airway Mallampati: II TM Distance: >3 FB Neck ROM: Full    Dental No notable dental hx. (+) Edentulous Upper and Edentulous Lower   Pulmonary pneumonia , Recent URI , Resolved, Current Smoker,  clear to auscultation  Pulmonary exam normal       Cardiovascular +CHF (EF 20%) + dysrhythmias Atrial Fibrillation Irregular  ECHO 04/23/11 EF 20-25% Diffuse hypokinesis. Mild LVH. Valves OK.   Neuro/Psych    GI/Hepatic   Endo/Other  Morbid obesity  Renal/GU      Musculoskeletal   Abdominal (+) obese,   Peds  Hematology   Anesthesia Other Findings   Reproductive/Obstetrics                         Anesthesia Physical Anesthesia Plan  ASA: III  Anesthesia Plan: General   Post-op Pain Management:    Induction: Intravenous  Airway Management Planned: Mask  Additional Equipment:   Intra-op Plan:   Post-operative Plan:   Informed Consent: I have reviewed the patients History and Physical, chart, labs and discussed the procedure including the risks, benefits and alternatives for the proposed anesthesia with the patient or authorized representative who has indicated his/her understanding and acceptance.     Plan Discussed with: CRNA and Surgeon  Anesthesia Plan Comments: (Plan routine monitors, GA)        Anesthesia Quick Evaluation

## 2011-04-26 NOTE — Progress Notes (Signed)
ANTICOAGULATION CONSULT NOTE - Initial Consult  Pharmacy Consult for Coumadin and Heparin Indication: atrial fibrillation  No Known Allergies  Patient Measurements: Height: 6\' 2"  (188 cm) Weight: 258 lb 6.1 oz (117.2 kg) (Stand-up scale.) IBW/kg (Calculated) : 82.2    Vital Signs: Temp: 97.7 F (36.5 C) (01/28 0738) Temp src: Oral (01/28 0738) BP: 99/59 mmHg (01/28 0738)  Labs:  Basename 04/26/11 0545 04/25/11 0510 04/24/11 0600 04/23/11 1022  HGB 13.8 13.7 -- --  HCT 40.6 39.9 40.7 --  PLT 265 285 297 --  APTT -- -- -- --  LABPROT 19.6* 17.1* 14.9 --  INR 1.63* 1.37 1.15 --  HEPARINUNFRC 0.55 0.53 0.54 --  CREATININE 1.27 1.16 1.05 --  CKTOTAL -- -- -- 79  CKMB -- -- -- 2.6  TROPONINI -- -- -- <0.30   Estimated Creatinine Clearance: 81 ml/min (by C-G formula based on Cr of 1.27).   Assessment: 65yom on heparin and Coumadin for Afib with RVR. Heparin level at goal and INR with trend and. Monitor INR closely as patient is also on amiodarone which can increase the INR. Plan for DCCV today.  Goal of Therapy:  INR 2-3 Heparin level: 0.3-0.7 units/ml  Plan:  1. Coumadin 7.5mg  po x 1 today 2. Continue Heparin at current rate at 2450 units/hr 3. Daily PT/INR and Heparin Levels/CBC   Benny Lennert, PharmD [phone] 04/26/2011,9:12 AM

## 2011-04-26 NOTE — Preoperative (Signed)
Beta Blockers   Reason not to administer Beta Blockers:Not Applicable 

## 2011-04-26 NOTE — Progress Notes (Signed)
Patient Name: Darryl [name]      SUBJECTIVE: less short of breath tolerating meds well  Past Medical History  Diagnosis Date  . Sinusitis     A.  s/p sinus surgery in past  . Tobacco abuse     A.  47 yrs 1/2-1ppd - currently 1/2 ppd.  . Atrial fibrillation     A.  04/22/2011  . History of drug abuse     Smoked marijuana 1-2/ week up until about a year ago. Snorted and/or smoke cocaine  approximately 1/month until 5 years ago  . 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 Acute  systolic heart failure     PHYSICAL EXAM Filed Vitals:   04/25/11 2346 04/26/11 0350 04/26/11 0500 04/26/11 0738  BP: 110/82 119/41  99/59  Pulse:      Temp: 97.6 F (36.4 C) 97.8 F (36.6 C)  97.7 F (36.5 C)  TempSrc: Oral   Oral  Resp:    16  Height:      Weight:   258 lb 6.1 oz (117.2 kg)   SpO2: 97% 96%  92%      Well developed and nourished in no acute distress HENT normal Neck supple with JVP-flat Carotids brisk and full without bruits Clear Irregularly irregular rate and rhythm with controlled ventricular response, no murmurs or gallops Abd-soft with active BS without hepatomegaly No Clubbing cyanosis edema Skin-warm and dry A & Oriented  Grossly normal sensory and motor function  TELEMETRY: Reviewed telemetry pt in af with rate about 100    Intake/Output Summary (Last 24 hours) at 04/26/11 0830 Last data filed at 04/26/11 0800  Gross per 24 hour  Intake  997.5 ml  Output   4100 ml  Net -3102.5 ml    LABS: Basic Metabolic Panel:  Lab 04/26/11 1610 04/25/11 0510 04/24/11 0600 04/23/11 0500 04/22/11 2205 04/22/11 1030 04/22/11 0855  NA 133* 135 136 135 -- 140 140  K 3.9 3.8 3.8 4.2 -- 4.3 4.4  CL 97 98 100 101 -- 107 106  CO2 25 23 22 24  -- -- --  GLUCOSE 124* 118* 141* 142* -- 113* 142*  BUN 21 24* 23 22 -- 19 19  CREATININE 1.27 1.16 1.05 1.22 -- 1.10 1.10  CALCIUM 9.3 9.1 -- -- -- -- --  MG -- -- 2.1 -- 2.1 -- --  PHOS -- -- -- -- --  -- --   Cardiac Enzymes:  Basename 04/23/11 1022  CKTOTAL 79  CKMB 2.6  CKMBINDEX --  TROPONINI <0.30   CBC:  Lab 04/26/11 0545 04/25/11 0510 04/24/11 0600 04/23/11 0500 04/22/11 1030 04/22/11 1024 04/22/11 0855 04/22/11 0850  WBC 8.9 9.8 10.0 10.2 -- 9.5 -- 9.6  NEUTROABS -- -- -- -- -- 7.0 -- 7.2  HGB 13.8 13.7 14.1 14.3 15.0 14.2 15.6 --  HCT 40.6 39.9 40.7 42.4 44.0 41.9 46.0 --  MCV 87.1 87.5 87.2 88.7 -- 89.1 -- 88.8  PLT 265 285 297 334 -- 313 -- 306   PROTIME:  Basename 04/26/11 0545 04/25/11 0510 04/24/11 0600  LABPROT 19.6* 17.1* 14.9  INR 1.63* 1.37 1.15   No components found with this basename: POCBNP:3   BNP 2143 on 1/26    ASSESSMENT AND PLAN:  Patient Active Hospital Problem List: Atrial fibrillation with RVR (04/22/2011)   Assessment: improved rate control on amio   Plan: TEEDCCV today and convert to PO amio Acute on chronic systolic heart failure (04/23/2011)  Assessment: as above   Plan: change to coreg in am if in NSR, watch dig level Cardiomyopathy? tachycardia mediated (04/23/2011)   Assessment: as above   Plan: wll need reassessment in a few weeks, anticipate cath in 4-6 weeks  Acute respiratory failure with hypoxia (04/23/2011)   Assessment: improved   Plan: contniue current plans Tobacco abuse (04/23/2011)   Assessment: not smoking    Plan: will get cig cessation consult    Signed, Sherryl Manges MD  04/26/2011

## 2011-04-26 NOTE — Interval H&P Note (Signed)
History and Physical Interval Note:  04/26/2011 1:25 PM  Darryl [name]  has presented today for surgery, with the diagnosis of atrial fibrillation  The various methods of treatment have been discussed with the patient and family. After consideration of risks, benefits and other options for treatment, the patient has consented to  Procedure(s): TRANSESOPHAGEAL ECHOCARDIOGRAM (TEE) CARDIOVERSION as a surgical intervention .  The patients' history has been reviewed, patient examined, no change in status, stable for surgery.  I have reviewed the patients' chart and labs.  Questions were answered to the patient's satisfaction.     Elyn Aquas.

## 2011-04-26 NOTE — Progress Notes (Signed)
*  PRELIMINARY RESULTS* Echocardiogram Echocardiogram Transesophageal has been performed.  Clide Deutscher 04/26/2011, 2:19 PM

## 2011-04-26 NOTE — H&P (View-Only) (Signed)
Patient Name: Darryl [name]      SUBJECTIVE: less short of breath tolerating meds well  Past Medical History  Diagnosis Date  . Sinusitis     A.  s/p sinus surgery in past  . Tobacco abuse     A.  47 yrs 1/2-1ppd - currently 1/2 ppd.  . Atrial fibrillation     A.  04/22/2011  . History of drug abuse     Smoked marijuana 1-2/ week up until about a year ago. Snorted and/or smoke cocaine  approximately 1/month until 5 years ago  . 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 Acute  systolic heart failure     PHYSICAL EXAM Filed Vitals:   04/25/11 2346 04/26/11 0350 04/26/11 0500 04/26/11 0738  BP: 110/82 119/41  99/59  Pulse:      Temp: 97.6 F (36.4 C) 97.8 F (36.6 C)  97.7 F (36.5 C)  TempSrc: Oral   Oral  Resp:    16  Height:      Weight:   258 lb 6.1 oz (117.2 kg)   SpO2: 97% 96%  92%      Well developed and nourished in no acute distress HENT normal Neck supple with JVP-flat Carotids brisk and full without bruits Clear Irregularly irregular rate and rhythm with controlled ventricular response, no murmurs or gallops Abd-soft with active BS without hepatomegaly No Clubbing cyanosis edema Skin-warm and dry A & Oriented  Grossly normal sensory and motor function  TELEMETRY: Reviewed telemetry pt in af with rate about 100    Intake/Output Summary (Last 24 hours) at 04/26/11 0830 Last data filed at 04/26/11 0800  Gross per 24 hour  Intake  997.5 ml  Output   4100 ml  Net -3102.5 ml    LABS: Basic Metabolic Panel:  Lab 04/26/11 0545 04/25/11 0510 04/24/11 0600 04/23/11 0500 04/22/11 2205 04/22/11 1030 04/22/11 0855  NA 133* 135 136 135 -- 140 140  K 3.9 3.8 3.8 4.2 -- 4.3 4.4  CL 97 98 100 101 -- 107 106  CO2 25 23 22 24 -- -- --  GLUCOSE 124* 118* 141* 142* -- 113* 142*  BUN 21 24* 23 22 -- 19 19  CREATININE 1.27 1.16 1.05 1.22 -- 1.10 1.10  CALCIUM 9.3 9.1 -- -- -- -- --  MG -- -- 2.1 -- 2.1 -- --  PHOS -- -- -- -- --  -- --   Cardiac Enzymes:  Basename 04/23/11 1022  CKTOTAL 79  CKMB 2.6  CKMBINDEX --  TROPONINI <0.30   CBC:  Lab 04/26/11 0545 04/25/11 0510 04/24/11 0600 04/23/11 0500 04/22/11 1030 04/22/11 1024 04/22/11 0855 04/22/11 0850  WBC 8.9 9.8 10.0 10.2 -- 9.5 -- 9.6  NEUTROABS -- -- -- -- -- 7.0 -- 7.2  HGB 13.8 13.7 14.1 14.3 15.0 14.2 15.6 --  HCT 40.6 39.9 40.7 42.4 44.0 41.9 46.0 --  MCV 87.1 87.5 87.2 88.7 -- 89.1 -- 88.8  PLT 265 285 297 334 -- 313 -- 306   PROTIME:  Basename 04/26/11 0545 04/25/11 0510 04/24/11 0600  LABPROT 19.6* 17.1* 14.9  INR 1.63* 1.37 1.15   No components found with this basename: POCBNP:3   BNP 2143 on 1/26    ASSESSMENT AND PLAN:  Patient Active Hospital Problem List: Atrial fibrillation with RVR (04/22/2011)   Assessment: improved rate control on amio   Plan: TEEDCCV today and convert to PO amio Acute on chronic systolic heart failure (04/23/2011)     Assessment: as above   Plan: change to coreg in am if in NSR, watch dig level Cardiomyopathy? tachycardia mediated (04/23/2011)   Assessment: as above   Plan: wll need reassessment in a few weeks, anticipate cath in 4-6 weeks  Acute respiratory failure with hypoxia (04/23/2011)   Assessment: improved   Plan: contniue current plans Tobacco abuse (04/23/2011)   Assessment: not smoking    Plan: will get cig cessation consult    Signed, Zackari Ruane MD  04/26/2011   

## 2011-04-26 NOTE — Brief Op Note (Signed)
    Transesophageal Echocardiogram Note  Darryl [name] [id number] 08/21/1945  Procedure: Transesophageal Echocardiogram Indications: A-Fib  Procedure Details Consent: Obtained Time Out: Verified patient identification, verified procedure,Performed site/side was marked, verified correct patient position, special equipment/implants available, Radiology Safety Procedures followed,  medications/allergies/relevent history reviewed, required imaging and test results available.    Medications: Fentanyl: 100 mcg MI Versed: 5 mg IV  Left Ventrical:  Severe LV dysfunction,  EF 20%  Mitral Valve: mild-moderate MR, mo MS  Aortic Valve: 3 leaflet valve, no AS or AI  Tricuspid Valve: trivial TR  Pulmonic Valve: normal  Left Atrium/ Left atrial appendage: no LAA thrombus, + spontaneous contrast ( smoke)  Atrial septum: no ASD or PFO  Aorta: normal   Complications: No apparent complications Patient did tolerate procedure well.   No thrombus.  Pt is OK for cardioversion.   Procedure: DC Cardioversion Indications: A-Fib  Procedure Details Consent: Obtained Time Out: Verified patient identification, verified procedure, site/side was marked, verified correct patient position, special equipment/implants available, Radiology Safety Procedures followed,  medications/allergies/relevent history reviewed, required imaging and test results available.  Performed  The patient has been on adequate anticoagulation ( IV heparin) and Amiodarone. TEE verified absence of LAA thrombus. The patient received IV Etomidate 16 mg for sedation.  Synchronous cardioversion was performed at 200 joules X 2.  The cardioversion was unsuccessful.  He remained in A-Fib.    Complications: No apparent complications Patient did tolerate procedure well.   Darryl [name], Jr., MD, FACC 04/26/2011, 2:10 PM   

## 2011-04-27 ENCOUNTER — Encounter (HOSPITAL_COMMUNITY): Payer: Self-pay | Admitting: Cardiology

## 2011-04-27 DIAGNOSIS — I4891 Unspecified atrial fibrillation: Secondary | ICD-10-CM

## 2011-04-27 LAB — CBC
Hemoglobin: 13.8 g/dL (ref 13.0–17.0)
MCH: 30.1 pg (ref 26.0–34.0)
MCHC: 34.5 g/dL (ref 30.0–36.0)
Platelets: 249 10*3/uL (ref 150–400)
RDW: 14.5 % (ref 11.5–15.5)

## 2011-04-27 LAB — PROTIME-INR: Prothrombin Time: 22 seconds — ABNORMAL HIGH (ref 11.6–15.2)

## 2011-04-27 MED ORDER — WARFARIN SODIUM 10 MG PO TABS
10.0000 mg | ORAL_TABLET | Freq: Once | ORAL | Status: AC
  Administered 2011-04-27: 10 mg via ORAL
  Filled 2011-04-27 (×2): qty 1

## 2011-04-27 MED ORDER — POTASSIUM CHLORIDE CRYS ER 20 MEQ PO TBCR
40.0000 meq | EXTENDED_RELEASE_TABLET | Freq: Two times a day (BID) | ORAL | Status: AC
  Administered 2011-04-27 (×2): 40 meq via ORAL
  Filled 2011-04-27 (×3): qty 2

## 2011-04-27 MED ORDER — POTASSIUM CHLORIDE CRYS ER 20 MEQ PO TBCR
40.0000 meq | EXTENDED_RELEASE_TABLET | Freq: Once | ORAL | Status: AC
  Administered 2011-04-27: 40 meq via ORAL

## 2011-04-27 NOTE — Progress Notes (Signed)
ANTICOAGULATION CONSULT NOTE - Initial Consult  Pharmacy Consult for Coumadin and Heparin Indication: atrial fibrillation  No Known Allergies  Patient Measurements: Height: 6\' 2"  (188 cm) Weight: 251 lb 5.2 oz (114 kg) IBW/kg (Calculated) : 82.2    Vital Signs: Temp: 97.4 F (36.3 C) (01/29 0741) Temp src: Oral (01/29 0741) BP: 90/61 mmHg (01/29 0741) Pulse Rate: 102  (01/29 0741)  Labs:  Basename 04/27/11 0545 04/26/11 0545 04/25/11 0510  HGB 13.8 13.8 --  HCT 40.0 40.6 39.9  PLT 249 265 285  APTT -- -- --  LABPROT 22.0* 19.6* 17.1*  INR 1.89* 1.63* 1.37  HEPARINUNFRC 0.58 0.55 0.53  CREATININE -- 1.27 1.16  CKTOTAL -- -- --  CKMB -- -- --  TROPONINI -- -- --   Estimated Creatinine Clearance: 79.9 ml/min (by C-G formula based on Cr of 1.27).   Assessment: 65yom on heparin and Coumadin for Afib with RVR. Heparin level at goal and INR at 1.87 with gradual trend up.  Noted s/p failed DCCV with plans for facilitation with ibutilide 1/30. Patient is also on amiodarone which can increase the INR.   Goal of Therapy:  INR 2-3 Heparin level: 0.3-0.7 units/ml  Plan:  1. Will give coumadin 10mg  today 2. Continue Heparin at current rate at 2450 units/hr 3. Daily PT/INR and Heparin Levels/CBC   Benny Lennert, PharmD [phone] 04/27/2011,8:25 AM

## 2011-04-27 NOTE — Progress Notes (Signed)
  Patient Name: Darryl [name]      SUBJECTIVE:failed cardioversion  But without shortness of breath,  Less edema  Past Medical History  Diagnosis Date  . Sinusitis     A.  s/p sinus surgery in past  . Tobacco abuse     A.  47 yrs 1/2-1ppd - currently 1/2 ppd.  . Atrial fibrillation     A.  04/22/2011  . History of drug abuse     Smoked marijuana 1-2/ week up until about a year ago. Snorted and/or smoke cocaine  approximately 1/month until 5 years ago  . 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 Acute  systolic heart failure     PHYSICAL EXAM Filed Vitals:   04/26/11 2345 04/27/11 0345 04/27/11 0346 04/27/11 0433  BP: 97/63  90/53   Pulse:      Temp: 98.2 F (36.8 C) 97.7 F (36.5 C)    TempSrc: Oral Oral    Resp:      Height:      Weight:    251 lb 5.2 oz (114 kg)  SpO2: 93% 96%      Well developed and nourished in no acute distress HENT normal Neck supple with JVP-flat Carotids brisk and full without bruits Clear Irregularly irregular rate and rhythm with controlled ventricular response, no murmurs or gallops Abd-soft with active BS without hepatomegaly No Clubbing cyanosis trace edema Skin-warm and dry A & Oriented  Grossly normal sensory and motor function  TELEMETRY: Reviewed telemetry pt in af improved rate control    Intake/Output Summary (Last 24 hours) at 04/27/11 0712 Last data filed at 04/27/11 0700  Gross per 24 hour  Intake 1092.5 ml  Output   3625 ml  Net -2532.5 ml    LABS: Basic Metabolic Panel:  Lab 04/26/11 4540 04/25/11 0510 04/24/11 0600 04/23/11 0500 04/22/11 2205 04/22/11 1030 04/22/11 0855  NA 133* 135 136 135 -- 140 140  K 3.9 3.8 3.8 4.2 -- 4.3 4.4  CL 97 98 100 101 -- 107 106  CO2 25 23 22 24  -- -- --  GLUCOSE 124* 118* 141* 142* -- 113* 142*  BUN 21 24* 23 22 -- 19 19  CREATININE 1.27 1.16 1.05 1.22 -- 1.10 1.10  CALCIUM 9.3 9.1 -- -- -- -- --  MG -- -- 2.1 -- 2.1 -- --  PHOS -- -- -- -- -- --  --    Lab 04/27/11 0545 04/26/11 0545 04/25/11 0510 04/24/11 0600 04/23/11 0500 04/22/11 1030 04/22/11 1024 04/22/11 0850  WBC 8.7 8.9 9.8 10.0 10.2 -- 9.5 9.6  NEUTROABS -- -- -- -- -- -- 7.0 7.2  HGB 13.8 13.8 13.7 14.1 14.3 15.0 14.2 --  HCT 40.0 40.6 39.9 40.7 42.4 44.0 41.9 --  MCV 87.1 87.1 87.5 87.2 88.7 -- 89.1 88.8  PLT 249 265 285 297 334 -- 313 306   PROTIME:  Basename 04/27/11 0545 04/26/11 0545 04/25/11 0510  LABPROT 22.0* 19.6* 17.1*  INR 1.89* 1.63* 1.37        ASSESSMENT AND PLAN:  Patient Active Hospital Problem List: Atrial fibrillation with RVR (04/22/2011)  is on amiodarone maybe ibutilide facilitation tomorrow Acute on chronic systolic heart failure (04/23/2011)  continue iv diuresis Cardiomyopathy? tachycardia mediated (04/23/2011) Blood pressure is somewhat limiting    * Tobacco abuse (04/23/2011)      Signed, Sherryl Manges MD  04/27/2011

## 2011-04-28 ENCOUNTER — Encounter (HOSPITAL_COMMUNITY): Payer: Self-pay

## 2011-04-28 ENCOUNTER — Inpatient Hospital Stay (HOSPITAL_COMMUNITY): Payer: BC Managed Care – PPO

## 2011-04-28 LAB — PROTIME-INR: INR: 2.23 — ABNORMAL HIGH (ref 0.00–1.49)

## 2011-04-28 LAB — BASIC METABOLIC PANEL
BUN: 22 mg/dL (ref 6–23)
CO2: 30 mEq/L (ref 19–32)
CO2: 32 mEq/L (ref 19–32)
Calcium: 10.1 mg/dL (ref 8.4–10.5)
Calcium: 10.4 mg/dL (ref 8.4–10.5)
Glucose, Bld: 122 mg/dL — ABNORMAL HIGH (ref 70–99)
Glucose, Bld: 109 mg/dL — ABNORMAL HIGH (ref 70–99)
Potassium: 4.7 mEq/L (ref 3.5–5.1)
Sodium: 136 mEq/L (ref 135–145)
Sodium: 137 mEq/L (ref 135–145)

## 2011-04-28 LAB — CBC
HCT: 42.3 % (ref 39.0–52.0)
MCH: 30 pg (ref 26.0–34.0)
MCV: 87.4 fL (ref 78.0–100.0)
RDW: 14.3 % (ref 11.5–15.5)
WBC: 9.3 10*3/uL (ref 4.0–10.5)

## 2011-04-28 MED ORDER — MAGNESIUM SULFATE 40 MG/ML IJ SOLN
2.0000 g | INTRAMUSCULAR | Status: AC
  Administered 2011-04-28: 2 g via INTRAVENOUS
  Filled 2011-04-28: qty 50

## 2011-04-28 MED ORDER — SODIUM CHLORIDE 0.9 % IV SOLN: 250.0000 mL | INTRAVENOUS | Status: DC

## 2011-04-28 MED ORDER — SODIUM CHLORIDE 0.9 % IJ SOLN: 3.0000 mL | INTRAMUSCULAR | Status: DC | PRN

## 2011-04-28 MED ORDER — DEXTROSE 5 % IV SOLN
1.0000 mg | Freq: Once | INTRAVENOUS | Status: AC
  Administered 2011-04-28: 1 mg via INTRAVENOUS
  Filled 2011-04-28: qty 10

## 2011-04-28 MED ORDER — ZOLPIDEM TARTRATE 5 MG PO TABS
5.0000 mg | ORAL_TABLET | Freq: Every evening | ORAL | Status: DC | PRN
  Administered 2011-04-28 – 2011-04-29 (×2): 5 mg via ORAL
  Filled 2011-04-28 (×2): qty 1

## 2011-04-28 MED ORDER — SODIUM CHLORIDE 0.9 % IJ SOLN
3.0000 mL | Freq: Two times a day (BID) | INTRAMUSCULAR | Status: DC
  Administered 2011-04-28 – 2011-04-29 (×3): 3 mL via INTRAVENOUS

## 2011-04-28 MED ORDER — WARFARIN SODIUM 5 MG PO TABS
5.0000 mg | ORAL_TABLET | Freq: Once | ORAL | Status: AC
  Administered 2011-04-28: 5 mg via ORAL
  Filled 2011-04-28: qty 1

## 2011-04-28 MED ORDER — AMIODARONE HCL 200 MG PO TABS
600.0000 mg | ORAL_TABLET | Freq: Three times a day (TID) | ORAL | Status: DC
  Administered 2011-04-28 – 2011-04-30 (×6): 600 mg via ORAL
  Filled 2011-04-28 (×9): qty 3

## 2011-04-28 MED ORDER — HYDROCORTISONE 1 % EX CREA: 1.0000 "application " | TOPICAL_CREAM | Freq: Three times a day (TID) | CUTANEOUS | Status: DC | PRN

## 2011-04-28 MED FILL — Perflutren Lipid Microsphere IV Susp 6.52 MG/ML: INTRAVENOUS | Qty: 2 | Status: AC

## 2011-04-28 NOTE — Procedures (Addendum)
Electrical Cardioversion Procedure Note Darryl [name] [id number] [birth year]  Procedure: Electrical Cardioversion  Ibutilide facilitated  Indications:  Atrial Fibrillation  Procedure Details Consent: Risks of procedure as well as the alternatives and risks of each were explained to the (patient/caregiver).  Consent for procedure obtained. Time Out: Verified patient identification, verified procedure, site/side was marked, verified correct patient position, special equipment/implants available, medications/allergies/relevent history reviewed, required imaging and test results available.  Performed  Patient placed on cardiac monitor, pulse oximetry, supplemental oxygen as necessary.  Sedation given: noen Pacer pads placed anterior and posterior chest.  Cardioverted 0 time(s).  Cardioverted at chemicially.  Evaluation Findings: Post procedure EKG shows: NSR Complications: VT non sustained Patient did tolerate procedure well.   Sherryl Manges 04/28/2011, 1:58 PM    Pt reverted to AF  Will undertake DCCV in the hopes of more sustained sinus which will hopefullly promote longterm sinus AF>>nsr>>af>>nsr  With him going in and out, will continue amiodarone and consider repeat attempt at cardioversion later this week just prior to discharge

## 2011-04-28 NOTE — Anesthesia Preprocedure Evaluation (Addendum)
Anesthesia Evaluation  Patient identified by MRN, date of birth, ID band Patient awake    Reviewed: Allergy & Precautions, H&P , NPO status , Patient's Chart, lab work & pertinent test results, reviewed documented beta blocker date and time   Airway Mallampati: II TM Distance: >3 FB Neck ROM: full    Dental  (+) Edentulous Upper, Edentulous Lower and Dental Advisory Given   Pulmonary pneumonia ,  clear to auscultation        Cardiovascular + DOE + dysrhythmias Atrial Fibrillation irregular Normal    Neuro/Psych    GI/Hepatic   Endo/Other    Renal/GU      Musculoskeletal   Abdominal   Peds  Hematology   Anesthesia Other Findings   Reproductive/Obstetrics                          Anesthesia Physical Anesthesia Plan  ASA: IV  Anesthesia Plan: General   Post-op Pain Management:    Induction: Intravenous  Airway Management Planned: Mask  Additional Equipment:   Intra-op Plan:   Post-operative Plan:   Informed Consent: I have reviewed the patients History and Physical, chart, labs and discussed the procedure including the risks, benefits and alternatives for the proposed anesthesia with the patient or authorized representative who has indicated his/her understanding and acceptance.   Dental Advisory Given and Dental advisory given  Plan Discussed with: Anesthesiologist, CRNA and Surgeon  Anesthesia Plan Comments:        Anesthesia Quick Evaluation

## 2011-04-28 NOTE — Progress Notes (Signed)
Patient Name: Darryl [name]      SUBJECTIVE: feels fine  Past Medical History  Diagnosis Date  . Sinusitis     A.  s/p sinus surgery in past  . Tobacco abuse     A.  47 yrs 1/2-1ppd - currently 1/2 ppd.  . Atrial fibrillation     A.  04/22/2011  . History of drug abuse     Smoked marijuana 1-2/ week up until about a year ago. Snorted and/or smoke cocaine  approximately 1/month until 5 years ago  . 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 Acute  systolic heart failure     PHYSICAL EXAM Filed Vitals:   04/28/11 0356 04/28/11 0752 04/28/11 1020 04/28/11 1021  BP:    98/76  Pulse:   92   Temp: 97.9 F (36.6 C) 97.5 F (36.4 C)    TempSrc: Oral Oral    Resp: 20     Height:      Weight: 248 lb 14.4 oz (112.9 kg)     SpO2: 94%       Well developed and nourished in no acute distress HENT normal Neck supple with JVP-flat Carotids brisk and full without bruits Clear Irregularly irregular rate and rhythm with controlled ventricular response, no murmurs or gallops Abd-soft with active BS without hepatomegaly No Clubbing cyanosis edema Skin-warm and dry A & Oriented  Grossly normal sensory and motor function   TELEMETRY: Reviewed telemetry pt in  af:    Intake/Output Summary (Last 24 hours) at 04/28/11 1111 Last data filed at 04/28/11 0900  Gross per 24 hour  Intake 1484.5 ml  Output   3200 ml  Net -1715.5 ml    LABS: Basic Metabolic Panel:  Lab 04/28/11 4540 04/27/11 0950 04/26/11 0545 04/25/11 0510 04/24/11 0600 04/23/11 0500 04/22/11 1030 04/22/11 0855  NA 136 -- 133* 135 136 135 140 140  K 4.5 -- 3.9 3.8 3.8 4.2 4.3 4.4  CL 97 -- 97 98 100 101 107 106  CO2 30 -- 25 23 22 24  -- --  GLUCOSE 122* -- 124* 118* 141* 142* 113* 142*  BUN 22 -- 21 24* 23 22 19 19   CREATININE 1.56* -- 1.27 1.16 1.05 1.22 1.10 1.10  CALCIUM 10.1 -- 9.3 -- -- -- -- --  MG -- 2.4 -- -- 2.1 -- -- --  PHOS -- -- -- -- -- -- -- --     Lab 04/28/11 0526  04/27/11 0545 04/26/11 0545 04/25/11 0510 04/24/11 0600 04/23/11 0500 04/22/11 1030 04/22/11 1024 04/22/11 0850  WBC 9.3 8.7 8.9 9.8 10.0 10.2 -- 9.5 --  NEUTROABS -- -- -- -- -- -- -- 7.0 7.2  HGB 14.5 13.8 13.8 13.7 14.1 14.3 15.0 -- --  HCT 42.3 40.0 40.6 39.9 40.7 42.4 44.0 -- --  MCV 87.4 87.1 87.1 87.5 87.2 88.7 -- 89.1 --  PLT 251 249 265 285 297 334 -- 313 --   PROTIME:  Basename 04/28/11 0526 04/27/11 0545 04/26/11 0545  LABPROT 25.1* 22.0* 19.6*  INR 2.23* 1.89* 1.63*   ECG QT interval about 520 msec  ASSESSMENT AND PLAN:  Patient Active Hospital Problem List: Atrial fibrillation with RVR (04/22/2011) anticipate ibutilide facilitated cardioversion today QT interval is long, but will be monitored through out ab   Acute on chronic systolic heart failure (04/23/2011) improved  Continue diuresis   Cardiomyopathy? tachycardia mediated (04/23/2011)on meds       Tobacco abuse (04/23/2011)  Assessment:    Plan:     Signed, Sherryl Manges MD  04/28/2011

## 2011-04-28 NOTE — Progress Notes (Signed)
ANTICOAGULATION CONSULT NOTE - Initial Consult  Pharmacy Consult for Coumadin and Heparin Indication: atrial fibrillation  No Known Allergies  Patient Measurements: Height: 6\' 2"  (188 cm) Weight: 248 lb 14.4 oz (112.9 kg) (bed scale) IBW/kg (Calculated) : 82.2    Vital Signs: Temp: 97.5 F (36.4 C) (01/30 0752) Temp src: Oral (01/30 0752) BP: 103/60 mmHg (01/30 0337) Pulse Rate: 95  (01/29 2137)  Labs:  Basename 04/28/11 0526 04/27/11 0545 04/26/11 0545  HGB 14.5 13.8 --  HCT 42.3 40.0 40.6  PLT 251 249 265  APTT -- -- --  LABPROT 25.1* 22.0* 19.6*  INR 2.23* 1.89* 1.63*  HEPARINUNFRC 0.44 0.58 0.55  CREATININE 1.56* -- 1.27  CKTOTAL -- -- --  CKMB -- -- --  TROPONINI -- -- --   Estimated Creatinine Clearance: 64.8 ml/min (by C-G formula based on Cr of 1.56).   Assessment: 65yom on heparin and Coumadin for Afib with RVR. Heparin level at goal and INR at 2.23 with gradual trend up.  Noted s/p failed DCCV with plans for facilitation with ibutilide today. Patient is also on amiodarone which can increase the INR.   Goal of Therapy:  INR 2-3 Heparin level: 0.3-0.7 units/ml  Plan:  1. Will give coumadin 5mg  today 2. Continue Heparin for 1 more day for 2 consecutive INRs > 2.0 3. Daily PT/INR and Heparin Levels/CBC   Benny Lennert, PharmD 04/28/2011,8:51 AM

## 2011-04-28 NOTE — Progress Notes (Signed)
   CARE MANAGEMENT NOTE 04/28/2011  Patient:  Darryl [name], Darryl [name]   Account Number:  [id number]  Date Initiated:  04/28/2011  Documentation initiated by:  GRAVES-BIGELOW,Adalae Baysinger  Subjective/Objective Assessment:   Pt admitted with postnasel drip- SOB worsening with minimal exertion. Also in with A fib and placed on cardizem gtt- failed cardioversion 1st time then cardioverted 04-28-11.     Action/Plan:   Pt states he is from home alone and has support of 2 sons in [place]. Pt stated he would not nned anything at d/c. Felt he had enough support.   Anticipated DC Date:  05/03/2011   Anticipated DC Plan:  HOME/SELF CARE      DC Planning Services  CM consult      Choice offered to / List presented to:             Status of service:  Completed, signed off Medicare Important Message given?   (If response is "NO", the following Medicare IM given date fields will be blank) Date Medicare IM given:   Date Additional Medicare IM given:    Discharge Disposition:  HOME/SELF CARE  Per UR Regulation:    Comments:

## 2011-04-28 NOTE — Preoperative (Signed)
Beta Blockers   Reason not to administer Beta Blockers:Not Applicable 

## 2011-04-29 LAB — CBC
HCT: 45.3 % (ref 39.0–52.0)
Hemoglobin: 15.9 g/dL (ref 13.0–17.0)
MCH: 30.2 pg (ref 26.0–34.0)
MCHC: 35.1 g/dL (ref 30.0–36.0)
MCV: 86.1 fL (ref 78.0–100.0)
RBC: 5.26 MIL/uL (ref 4.22–5.81)

## 2011-04-29 LAB — GLUCOSE, CAPILLARY: Glucose-Capillary: 113 mg/dL — ABNORMAL HIGH (ref 70–99)

## 2011-04-29 MED ORDER — FUROSEMIDE 40 MG PO TABS
40.0000 mg | ORAL_TABLET | Freq: Two times a day (BID) | ORAL | Status: DC
  Administered 2011-04-29: 40 mg via ORAL
  Filled 2011-04-29 (×4): qty 1

## 2011-04-29 NOTE — Progress Notes (Signed)
Subjective:   Darryl [name] is a 66 yo with newly diagnosed CHF and A-fib.  He had a TEE and cardioversion earlier this week ( unsuccessful) and yesterday had Ibutilide   infusion with cardioversion ( transient NSR but he later reverted to A-Fib  Feels well today. Will transfer to tele,      . amiodarone  600 mg Oral Q8H  . digoxin  0.125 mg Oral Daily  . fluticasone  2 spray Each Nare Daily  . furosemide  60 mg Intravenous Q8H  . ibutilide (CORVERT) IV bolus (>/= 60 kg) Cardioversion  1 mg Intravenous Once  . lisinopril  5 mg Oral Daily  . magnesium sulfate 1 - 4 g bolus IVPB  2 g Intravenous STAT  . metoprolol succinate  50 mg Oral BID  . nicotine  21 mg Transdermal Daily  . sodium chloride  3 mL Intravenous Q12H  . warfarin  5 mg Oral ONCE-1800  . DISCONTD: amiodarone  400 mg Oral BID      . sodium chloride 10 mL/hr (04/22/11 1847)  . sodium chloride Stopped (04/28/11 2000)  . sodium chloride    . DISCONTD: heparin 2,450 Units/hr (04/28/11 0402)    Objective:  Vital Signs in the last 24 hours: Blood pressure 88/54, pulse 103, temperature 98.4 F (36.9 C), temperature source Oral, resp. rate 16, height 6\' 2"  (1.88 m), weight 238 lb 8.6 oz (108.2 kg), SpO2 92.00%. Temp:  [97.5 F (36.4 C)-98.8 F (37.1 C)] 98.4 F (36.9 C) (01/31 0420) Pulse Rate:  [92-122] 103  (01/30 2124) Resp:  [14-18] 16  (01/31 0420) BP: (88-121)/(54-76) 88/54 mmHg (01/31 0420) SpO2:  [92 %-96 %] 92 % (01/31 0420) Weight:  [238 lb 8.6 oz (108.2 kg)] 238 lb 8.6 oz (108.2 kg) (01/31 0420)  Intake/Output from previous day: 01/30 0701 - 01/31 0700 In: 1484 [P.O.:1260; I.V.:162; IV Piggyback:62] Out: 3075 [Urine:3075] Intake/Output from this shift:    Physical Exam:  Physical Exam: Blood pressure 88/54, pulse 103, temperature 98.4 F (36.9 C), temperature source Oral, resp. rate 16, height 6\' 2"  (1.88 m), weight 238 lb 8.6 oz (108.2 kg), SpO2 92.00%. General: Well developed, well nourished, in  no acute distress. Head: Normocephalic, atraumatic, sclera non-icteric, no xanthomas, nares are without discharge.  Neck: Supple. Negative for carotid bruits. JVD not elevated. Lungs: Clear bilaterally to auscultation without wheezes, rales, or rhonchi. Breathing is unlabored. Heart: Irreg. irreg. Abdomen: Soft, non-tender, non-distended with normoactive bowel sounds. No hepatomegaly. No rebound/guarding. No obvious abdominal masses. Msk:  Strength and tone appear normal for age. Extremities: No clubbing or cyanosis. No edema.  Distal pedal pulses are 2+ and equal bilaterally. Neuro: Alert and oriented X 3. Moves all extremities spontaneously. Psych:  Responds to questions appropriately with a normal affect.    Lab Results:   Basename 04/28/11 1133 04/28/11 0526 04/27/11 0950  NA 137 136 --  K 4.7 4.5 --  CL 94* 97 --  CO2 32 30 --  GLUCOSE 109* 122* --  BUN 20 22 --  CREATININE 1.49* 1.56* --  CALCIUM 10.4 10.1 --  MG -- -- 2.4  PHOS -- -- --   INR = 2.36  No results found for this basename: AST:2,ALT:2,ALKPHOS:2,BILITOT:2,PROT:2,ALBUMIN:2 in the last 72 hours No results found for this basename: LIPASE:2,AMYLASE:2 in the last 72 hours  Basename 04/29/11 0508 04/28/11 0526  WBC 9.0 9.3  NEUTROABS -- --  HGB 15.9 14.5  HCT 45.3 42.3  MCV 86.1 87.4  PLT 288  251   No results found for this basename: CKTOTAL:4,CKMB:4,TROPONINI:4 in the last 72 hours No components found with this basename: POCBNP:3 No results found for this basename: DDIMER in the last 72 hours No results found for this basename: HGBA1C in the last 72 hours No results found for this basename: CHOL,HDL,LDLCALC,TRIG,CHOLHDL in the last 72 hours No results found for this basename: TSH,T4TOTAL,FREET3,T3FREE,THYROIDAB in the last 72 hours No results found for this basename: VITAMINB12,FOLATE,FERRITIN,TIBC,IRON,RETICCTPCT in the last 72 hours  Tele:  A-fib  Assessment/Plan:   1.  Atrial fibrillation with RVR  (04/22/2011)  Will continue with Amiodarone loading for a month and try cardioversion at that time.  Tolerating it well  2.  CHF:  Acute on Chronic systolic chf,   EF is ~ 20-25%.  Continue Lisinopril, metoprolol, will change Lasix to PO.     Disposition: transfer to tele. Home soon ? Tomorrow.  INR is theraputic.   Vesta Mixer, Montez Hageman., MD, [facility] 04/29/2011, 7:52 AM LOS: Day 7

## 2011-04-29 NOTE — Progress Notes (Addendum)
ANTICOAGULATION CONSULT NOTE - Initial Consult  Pharmacy Consult for Coumadin  Indication: atrial fibrillation  No Known Allergies  Patient Measurements: Height: 6\' 2"  (188 cm) Weight: 238 lb 8.6 oz (108.2 kg) IBW/kg (Calculated) : 82.2    Vital Signs: Temp: 98.2 F (36.8 C) (01/31 0827) Temp src: Oral (01/31 0827) BP: 113/41 mmHg (01/31 0827) Pulse Rate: 99  (01/31 0827)  Labs:  Basename 04/29/11 0508 04/28/11 1133 04/28/11 0526 04/27/11 0545  HGB 15.9 -- 14.5 --  HCT 45.3 -- 42.3 40.0  PLT 288 -- 251 249  APTT -- -- -- --  LABPROT 26.2* 25.1* 25.1* --  INR 2.36* 2.23* 2.23* --  HEPARINUNFRC -- -- 0.44 0.58  CREATININE -- 1.49* 1.56* --  CKTOTAL -- -- -- --  CKMB -- -- -- --  TROPONINI -- -- -- --   Estimated Creatinine Clearance: 66.5 ml/min (by C-G formula based on Cr of 1.49).   Assessment: 65yom on heparin and Coumadin for Afib with RVR.  INR at 2.23 with gradual trend up and heparin was discontinued on 04/28/2011.  Noted s/p failed DCCV 1/29 and ibutilide facilitated on 1/30 now on high dose amiodarone.   Goal of Therapy:  INR 2-3   Plan:  -Will give coumadin 5mg  today -Anticipate a maintenance of 5mg /day upon d/c but amiodarone makes estimation difficult   Benny Lennert, PharmD 04/29/2011,8:40 AM

## 2011-04-30 ENCOUNTER — Other Ambulatory Visit: Payer: Self-pay

## 2011-04-30 ENCOUNTER — Encounter (HOSPITAL_COMMUNITY): Payer: Self-pay | Admitting: Nurse Practitioner

## 2011-04-30 DIAGNOSIS — N182 Chronic kidney disease, stage 2 (mild): Secondary | ICD-10-CM | POA: Insufficient documentation

## 2011-04-30 DIAGNOSIS — I5031 Acute diastolic (congestive) heart failure: Secondary | ICD-10-CM

## 2011-04-30 LAB — BASIC METABOLIC PANEL
CO2: 26 mEq/L (ref 19–32)
Calcium: 9.9 mg/dL (ref 8.4–10.5)
GFR calc non Af Amer: 47 mL/min — ABNORMAL LOW (ref >90)
Glucose, Bld: 188 mg/dL — ABNORMAL HIGH (ref 70–99)
Potassium: 4.5 mEq/L (ref 3.5–5.1)
Sodium: 133 mEq/L — ABNORMAL LOW (ref 135–145)

## 2011-04-30 LAB — CBC
HCT: 47.6 % (ref 39.0–52.0)
MCV: 87.3 fL (ref 78.0–100.0)
RBC: 5.45 MIL/uL (ref 4.22–5.81)
WBC: 10 10*3/uL (ref 4.0–10.5)

## 2011-04-30 LAB — PROTIME-INR: INR: 2.04 — ABNORMAL HIGH (ref 0.00–1.49)

## 2011-04-30 MED ORDER — WARFARIN SODIUM 5 MG PO TABS: 5.0000 mg | ORAL_TABLET | Freq: Every day | ORAL | Status: DC

## 2011-04-30 MED ORDER — WARFARIN SODIUM 7.5 MG PO TABS
7.5000 mg | ORAL_TABLET | Freq: Once | ORAL | Status: DC
  Filled 2011-04-30: qty 1

## 2011-04-30 MED ORDER — NICOTINE 21 MG/24HR TD PT24: 1.0000 | MEDICATED_PATCH | Freq: Every day | TRANSDERMAL | Status: DC

## 2011-04-30 MED ORDER — AMIODARONE HCL 200 MG PO TABS
200.0000 mg | ORAL_TABLET | Freq: Two times a day (BID) | ORAL | Status: DC
  Filled 2011-04-30: qty 1

## 2011-04-30 MED ORDER — POTASSIUM CHLORIDE CRYS ER 10 MEQ PO TBCR: 10.0000 meq | EXTENDED_RELEASE_TABLET | Freq: Every day | ORAL | Status: DC

## 2011-04-30 MED ORDER — DIGOXIN 125 MCG PO TABS: 0.1250 mg | ORAL_TABLET | Freq: Every day | ORAL | Status: DC

## 2011-04-30 MED ORDER — METOPROLOL SUCCINATE ER 50 MG PO TB24: 50.0000 mg | ORAL_TABLET | Freq: Two times a day (BID) | ORAL | Status: DC

## 2011-04-30 MED ORDER — LISINOPRIL 5 MG PO TABS: 5.0000 mg | ORAL_TABLET | Freq: Every day | ORAL | Status: DC

## 2011-04-30 MED ORDER — FUROSEMIDE 40 MG PO TABS: 40.0000 mg | ORAL_TABLET | Freq: Every day | ORAL | Status: DC

## 2011-04-30 MED ORDER — AMIODARONE HCL 200 MG PO TABS: 200.0000 mg | ORAL_TABLET | Freq: Two times a day (BID) | ORAL | Status: DC

## 2011-04-30 MED ORDER — POTASSIUM CHLORIDE CRYS ER 10 MEQ PO TBCR
10.0000 meq | EXTENDED_RELEASE_TABLET | Freq: Every day | ORAL | Status: DC
Start: 2011-04-30 — End: 2011-04-30
  Administered 2011-04-30: 10 meq via ORAL
  Filled 2011-04-30: qty 1

## 2011-04-30 MED ORDER — FUROSEMIDE 40 MG PO TABS
40.0000 mg | ORAL_TABLET | Freq: Every day | ORAL | Status: DC
Start: 2011-04-30 — End: 2011-04-30
  Administered 2011-04-30: 40 mg via ORAL
  Filled 2011-04-30: qty 1

## 2011-04-30 NOTE — Progress Notes (Signed)
Discharge instructions and patient education complete. Pt had no further questions. Prescriptions sent to patient's pharmacy. IV site d/c. Site WNL. No s/s of distress. D/C home via wheelchair. Dion Saucier

## 2011-04-30 NOTE — Discharge Summary (Signed)
Patient ID: Darryl [name],  MRN: [id number], DOB/AGE: [birth year] 66 y.o.  Admit date: 04/22/2011 Discharge date: 04/30/2011  Primary Care Provider: M. 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 Wall thickness was increased in a pattern of mild LVH. Systolic function was severely reduced. The estimated ejection fraction was in the range of 20% to 25%. Diffuse hypokinesis. Doppler parameters are consistent with high ventricular filling pressure. - Left atrium: The atrium was mildly dilated. - Right ventricle: The cavity size was moderately dilated. - Right atrium: The atrium was mildly dilated. ___________________________________________________________  04/26/2011 TEE and attempted DCCV Study Conclusions  - Left ventricle: Systolic function was severely reduced. The estimated ejection fraction was in the range of 25% to 30%. - Aortic valve: No evidence of vegetation. - Mitral valve: Mild to moderate regurgitation. - Left atrium: No evidence of thrombus in the atrial cavity or appendage. - Tricuspid valve: No evidence of vegetation.       **DCCV failed** ____________________________________________________________  04/28/2011 Ibutilide Facilitated Cardioversion  -With Ibutilide infusion, pt was in and out of sinus rhythm.  Ultimately, he remained in a.fib.  No DCCV performed. _____________________________________________________________  History of Present Illness  66 year old male without prior cardiac history. Approximately 7-10 days prior to admission patient began to experience  progressively worsening chest congestion associated with productive cough and yellow sputum. On January 21stis , patient was evaluated in the emergency department and treated for sinus congestion and cough and subsequently discharged home from the ED.  It should be noted that his HR was documented @ 98 on that day however ECG was not performed. Unfortunately, his dyspnea progressed and he presented to an urgent care on January 24 and was found to be in A. fib with RVR. He was transferred to the [facility] emergency department where he was treated with IV diltiazem as well as heparin. Chest x-ray suggested right lower lobe pneumonia and he was placed on IV antibiotics. Cardiology was called for consultation. Duration of atrial fibrillation was unknown.  Patient denied palpitations.    Hospital Course   Following admission, pt remained dyspneic with any exertion and continued to experience coughing while lying flat.  2D echocardiogram was carried out on 1/25 revealing an EF of 20-25% with diffuse hypokinesis.  As duration of afib was unknown, it was not clear if this represented a tachycardia-mediated cardiomyopathy.  It was felt however that pursuing DCCV (with TEE) sooner rather than later would be important and he was placed on coumadin therapy (in addition to heparin).  Further, in light of LV dysfunction, IV diltiazem was discontinued in favor of amiodarone.  In the setting of ongoing a.fib with dyspnea, pt was felt to be volume overloaded.  He was placed on IV lasix and was initially slow to diurese with ongoing dyspnea requiring titration of his diuretic.  He was subsequently placed on oral BB and ACEI therapy as well.  As it became clearer that his presentation was more consistent with CHF rather than PNA, antibiotics were discontinued and cardiology assumed care for the patient.  With continued diuresis (reduction in weight from 263lbs on admission to 238lbs on discharge) pt began to clinically  improve.  Decision was made to perform TEE and DCCV on 1/28.  He had no evidence of Left atrial appendage thrombus on TEE and cardioversion was attempted but unsuccessful.  It was felt that pt should remain on amiodarone and prior to discharge one additional attempt at cardioversion with ibutilide facilitation should be made.  On 1/30, ibutilide facilitated cardioversion was undertaken and following initiation of ibutilide, pt did convert to sinus but then reverted to a.fib.  This happened a second time and ultimately pt remained in a.fib.  As a result, pt did not undergo electrical cardioversion and instead it was felt that pt should remain on amiodarone for at least 1 month with repeat attempt at DCCV (without TEE presuming INR's remain therapeutic) at that time.  Pts lasix has been converted to an oral formulation.  His INR is therapeutic and he is felt to be stable for d/c today.  We have arranged for Coumadin clinic and cardiology f/u on 05/05/11, at which time we will also re-evaluate his BMET as he has had mild renal insufficiency while hospitalized.  Though his cardiomyopathy is currently felt to be nonischemic in the setting of rapid a.fib, we do feel that he will require an ischemic workup, once his rhythm is adequately managed.  Discharge Vitals:  Blood pressure 108/74, pulse 64, temperature 97.4 F (36.3 C), temperature source Oral, resp. rate 16, height 6\' 2"  (1.88 m), weight 238 lb 5.1 oz (108.1 kg), SpO2 96.00%.   Labs: CBC:  Basename 04/30/11 0505 04/29/11 0508  WBC 10.0 9.0  NEUTROABS -- --  HGB 16.3 15.9  HCT 47.6 45.3  MCV 87.3 86.1  PLT 291 288   Basic Metabolic Panel:  Basename 04/30/11 0827 04/28/11 1133 04/27/11 0950  NA 133* 137 --  K 4.5 4.7 --  CL 94* 94* --  CO2 26 32 --  GLUCOSE 188* 109* --  BUN 27* 20 --  CREATININE 1.52* 1.49* --  CALCIUM 9.9 10.4 --  MG -- -- 2.4  PHOS -- -- --   Lab Results  Component Value Date   TSH 2.303 04/23/2011   Lab Results   Component Value Date   CKTOTAL 79 04/23/2011   CKMB 2.6 04/23/2011   TROPONINI <0.30 04/23/2011    Disposition:  Follow-up Information    Follow up with BELFI, MELANIE, MD. (As needed)       Follow up with [facility] on 05/05/2011. (9:00)    Contact information:   [address] [place] [phone]      Follow up with Norma Fredrickson, NP on 05/05/2011. (8:30)    Contact information:   [address] [place] [address] [phone]          Discharge Medications:  Medication List  As of 04/30/2011  9:48 AM   STOP taking these medications         guaiFENesin-codeine 100-10 MG/5ML syrup      NYQUIL COLD & FLU PO         TAKE these medications         amiodarone 200 MG tablet   Commonly known as: PACERONE   Take 1 tablet (200 mg total) by mouth 2 (two) times daily.      digoxin 0.125 MG tablet   Commonly known as: LANOXIN   Take 1 tablet (0.125 mg total) by mouth daily.      fluticasone 50 MCG/ACT nasal spray   Commonly known as: FLONASE  Place 2 sprays into the nose daily.      furosemide 40 MG tablet   Commonly known as: LASIX   Take 1 tablet (40 mg total) by mouth daily.      lisinopril 5 MG tablet   Commonly known as: PRINIVIL,ZESTRIL   Take 1 tablet (5 mg total) by mouth daily.      metoprolol succinate 50 MG 24 hr tablet   Commonly known as: TOPROL-XL   Take 1 tablet (50 mg total) by mouth 2 (two) times daily. Take with or immediately following a meal.      nicotine 21 mg/24hr patch   Commonly known as: NICODERM CQ - dosed in mg/24 hours   Place 1 patch onto the skin daily.      potassium chloride 10 MEQ tablet   Commonly known as: K-DUR,KLOR-CON   Take 1 tablet (10 mEq total) by mouth daily.      warfarin 5 MG tablet   Commonly known as: COUMADIN   Take 1 tablet (5 mg total) by mouth at bedtime.            Outstanding Labs/Studies  INR and BMET on 2/6 when seen in  office.  Duration of Discharge Encounter: Greater than 30 minutes including physician time.  Signed, Nicolasa Ducking NP 04/30/2011, 9:48 AM   Attending Note:   The patient was seen and examined.  Agree with assessment and plan as noted above.  See my note from earlier today.  Vesta Mixer, Montez Hageman., MD, [facility] 04/30/2011, 10:53 AM

## 2011-04-30 NOTE — Progress Notes (Addendum)
Subjective:   Darryl [name] is a 66 yo with newly diagnosed CHF and A-fib.  He had a TEE and cardioversion earlier this week ( unsuccessful) and yesterday had Ibutilide   infusion with cardioversion ( transient NSR but he later reverted to A-Fib)  Feels well today.  Has not ambulated in halls yet.  Doing well .  Will DC to home after ambulation.  Will check BMP.     [name] amiodarone  600 mg Oral Q8H  . digoxin  0.125 mg Oral Daily  . fluticasone  2 spray Each Nare Daily  . furosemide  40 mg Oral BID  . lisinopril  5 mg Oral Daily  . metoprolol succinate  50 mg Oral BID  . nicotine  21 mg Transdermal Daily  . sodium chloride  3 mL Intravenous Q12H  . DISCONTD: furosemide  60 mg Intravenous Q8H      . sodium chloride Stopped (04/28/11 2000)  . sodium chloride    . DISCONTD: sodium chloride 10 mL/hr (04/22/11 1847)    Objective:  Vital Signs in the last 24 hours: Blood pressure 108/74, pulse 64, temperature 97.4 F (36.3 C), temperature source Oral, resp. rate 16, height 6\' 2"  (1.88 m), weight 238 lb 5.1 oz (108.1 kg), SpO2 96.00%. Temp:  [97.4 F (36.3 C)-98.2 F (36.8 C)] 97.4 F (36.3 C) (02/01 0504) Pulse Rate:  [64-99] 64  (02/01 0504) Resp:  [16-20] 16  (02/01 0504) BP: (106-115)/(41-78) 108/74 mmHg (02/01 0504) SpO2:  [95 %-96 %] 96 % (02/01 0504) Weight:  [238 lb 5.1 oz (108.1 kg)] 238 lb 5.1 oz (108.1 kg) (02/01 0504)  Intake/Output from previous day: 01/31 0701 - 02/01 0700 In: 1446 [P.O.:1440; I.V.:6] Out: 2100 [Urine:2100] Intake/Output from this shift:    Physical Exam:  Physical Exam: Blood pressure 108/74, pulse 64, temperature 97.4 F (36.3 C), temperature source Oral, resp. rate 16, height 6\' 2"  (1.88 m), weight 238 lb 5.1 oz (108.1 kg), SpO2 96.00%. General: Well developed, well nourished, in no acute distress. Head: Normocephalic, atraumatic, sclera non-icteric, no xanthomas, nares are without discharge.  Neck: Supple. Negative for carotid bruits. JVD  not elevated. Lungs: Clear bilaterally to auscultation without wheezes, rales, or rhonchi. Breathing is unlabored. Heart: Irreg. irreg. Abdomen: Soft, non-tender, non-distended with normoactive bowel sounds. No hepatomegaly. No rebound/guarding. No obvious abdominal masses. Msk:  Strength and tone appear normal for age. Extremities: No clubbing or cyanosis. No edema.  Distal pedal pulses are 2+ and equal bilaterally. Neuro: Alert and oriented X 3. Moves all extremities spontaneously. Psych:  Responds to questions appropriately with a normal affect.    Lab Results:   Basename 04/28/11 1133 04/28/11 0526 04/27/11 0950  NA 137 136 --  K 4.7 4.5 --  CL 94* 97 --  CO2 32 30 --  GLUCOSE 109* 122* --  BUN 20 22 --  CREATININE 1.49* 1.56* --  CALCIUM 10.4 10.1 --  MG -- -- 2.4  PHOS -- -- --   Lab Results  Component Value Date   INR 2.04* 04/30/2011   INR 2.36* 04/29/2011   INR 2.23* 04/28/2011     No results found for this basename: AST:2,ALT:2,ALKPHOS:2,BILITOT:2,PROT:2,ALBUMIN:2 in the last 72 hours No results found for this basename: LIPASE:2,AMYLASE:2 in the last 72 hours  Basename 04/30/11 0505 04/29/11 0508  WBC 10.0 9.0  NEUTROABS -- --  HGB 16.3 15.9  HCT 47.6 45.3  MCV 87.3 86.1  PLT 291 288   No results found for this basename:  CKTOTAL:4,CKMB:4,TROPONINI:4 in the last 72 hours No components found with this basename: POCBNP:3 No results found for this basename: DDIMER in the last 72 hours No results found for this basename: HGBA1C in the last 72 hours No results found for this basename: CHOL,HDL,LDLCALC,TRIG,CHOLHDL in the last 72 hours No results found for this basename: TSH,T4TOTAL,FREET3,T3FREE,THYROIDAB in the last 72 hours No results found for this basename: VITAMINB12,FOLATE,FERRITIN,TIBC,IRON,RETICCTPCT in the last 72 hours  Tele:  A-fib  Assessment/Plan:   1.  Atrial fibrillation with RVR (04/22/2011)  Will continue with Amiodarone loading for a month and  try cardioversion at that time.  Tolerating it well  DC on Amio 200 BID.   Digoxin 0.125 QD Coumadin  Coumadin clinic next Tuesday or Wed.  Also get BMP.   2.  CHF:  Acute on Chronic systolic chf,   EF is ~ 20-25%.  Continue Lisinopril, metoprolol, will change Lasix to PO.  Lasix 40 QD Metoprolol 50 BID Lisinopril 5 QD Kdur 10 QD   To see me or Lori in 1 week.   Disposition:   DC home later this AM  if BMP is OK.  > 30 min spent on DC and  planning  Alvia Grove., MD, [facility] 04/30/2011, 7:08 AM LOS: Day 8

## 2011-04-30 NOTE — Progress Notes (Addendum)
ANTICOAGULATION CONSULT NOTE - Follow-Up Consult  Pharmacy Consult for Coumadin and Heparin Indication: atrial fibrillation  No Known Allergies  Patient Measurements: Height: 6\' 2"  (188 cm) Weight: 238 lb 5.1 oz (108.1 kg) IBW/kg (Calculated) : 82.2    Vital Signs: Temp: 97.4 F (36.3 C) (02/01 0504) Temp src: Oral (02/01 0504) BP: 108/74 mmHg (02/01 0504) Pulse Rate: 64  (02/01 0504)  Labs:  Basename 04/30/11 0827 04/30/11 0505 04/29/11 0508 04/28/11 1133 04/28/11 0526  HGB -- 16.3 15.9 -- --  HCT -- 47.6 45.3 -- 42.3  PLT -- 291 288 -- 251  APTT -- -- -- -- --  LABPROT -- 23.4* 26.2* 25.1* --  INR -- 2.04* 2.36* 2.23* --  HEPARINUNFRC -- -- -- -- 0.44  CREATININE 1.52* -- -- 1.49* 1.56*  CKTOTAL -- -- -- -- --  CKMB -- -- -- -- --  TROPONINI -- -- -- -- --   Estimated Creatinine Clearance: 65.2 ml/min (by C-G formula based on Cr of 1.52).   Assessment: 65yom on heparin and Coumadin for Afib with RVR. Heparin now discontinued with therapeutic INR.  Noted s/p failed DCCV 1/29 and ibutilide facilitated on 1/30 now on high dose amiodarone.   He will most likely need Warfarin 5mg  as his maintenance dose.  He did not receive a dose yesterday, so will bump his dose today to avoid dropping below desired goal range.  Goal of Therapy:  INR 2-3   Plan:  -Will give coumadin 7.5mg  today x 1 and start 5 mg daily tomorrow. -F/U AM labs and adjust.   Nadara Mustard, PharmD., MS Clinical Pharmacist Pager:  [phone] 04/30/2011,10:03 AM

## 2011-05-03 ENCOUNTER — Telehealth: Payer: Self-pay | Admitting: Internal Medicine

## 2011-05-03 ENCOUNTER — Telehealth: Payer: Self-pay | Admitting: Cardiovascular Disease

## 2011-05-03 NOTE — Telephone Encounter (Signed)
All Records faxed to [phone] 05/03/11/KM

## 2011-05-03 NOTE — Telephone Encounter (Signed)
Pt has a note to return to work on the 6th and per pt he was told he could return to work when he wants to so they need another note stating he can return to work and what is his restrictions if any for work

## 2011-05-03 NOTE — Telephone Encounter (Signed)
I reviewed with Dr. Graciela Husbands. He states that he remembers the patient, but that Dr. Elease Hashimoto had wanted to follow with him. Per Dr. Graciela Husbands, he should probably keep his appointment on 2/6 with Scott before he returns to work. He does have a new diagnosis of an EF of 20-25 %. I have called Chrissie Noa and left a message on his identified voice mail that we need to see the patient before he returns to work.

## 2011-05-03 NOTE — Telephone Encounter (Signed)
I spoke with Chrissie Noa in HR to find out what the patient's job entailed. 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 They have a lifting requirement of up to about 70 lbs. I explained to Chrissie Noa I would have to review with Dr. Graciela Husbands regarding his returning to work. The patient did show up today and gave them his RTW note that is dated for 2/6. I explained to Chrissie Noa that the patient does have follow up scheduled for 2/6 with our office. I will review with Dr. Graciela Husbands and call him back or fax a note to him if he can go ahead and go back.

## 2011-05-05 ENCOUNTER — Ambulatory Visit (INDEPENDENT_AMBULATORY_CARE_PROVIDER_SITE_OTHER): Payer: BC Managed Care – PPO | Admitting: Nurse Practitioner

## 2011-05-05 ENCOUNTER — Ambulatory Visit: Payer: BC Managed Care – PPO | Admitting: *Deleted

## 2011-05-05 ENCOUNTER — Ambulatory Visit (INDEPENDENT_AMBULATORY_CARE_PROVIDER_SITE_OTHER): Payer: BC Managed Care – PPO | Admitting: *Deleted

## 2011-05-05 ENCOUNTER — Other Ambulatory Visit (INDEPENDENT_AMBULATORY_CARE_PROVIDER_SITE_OTHER): Payer: BC Managed Care – PPO | Admitting: *Deleted

## 2011-05-05 ENCOUNTER — Encounter: Payer: Self-pay | Admitting: Nurse Practitioner

## 2011-05-05 DIAGNOSIS — N289 Disorder of kidney and ureter, unspecified: Secondary | ICD-10-CM

## 2011-05-05 DIAGNOSIS — I4891 Unspecified atrial fibrillation: Secondary | ICD-10-CM

## 2011-05-05 DIAGNOSIS — I428 Other cardiomyopathies: Secondary | ICD-10-CM

## 2011-05-05 DIAGNOSIS — Z720 Tobacco use: Secondary | ICD-10-CM

## 2011-05-05 DIAGNOSIS — F17200 Nicotine dependence, unspecified, uncomplicated: Secondary | ICD-10-CM

## 2011-05-05 DIAGNOSIS — I5031 Acute diastolic (congestive) heart failure: Secondary | ICD-10-CM

## 2011-05-05 DIAGNOSIS — Z7901 Long term (current) use of anticoagulants: Secondary | ICD-10-CM | POA: Insufficient documentation

## 2011-05-05 LAB — BASIC METABOLIC PANEL
BUN: 29 mg/dL — ABNORMAL HIGH (ref 6–23)
CO2: 25 mEq/L (ref 19–32)
Calcium: 9.5 mg/dL (ref 8.4–10.5)
Chloride: 101 mEq/L (ref 96–112)
Creatinine, Ser: 1.5 mg/dL (ref 0.4–1.5)
GFR: 51.4 mL/min — ABNORMAL LOW (ref >60.00)
Glucose, Bld: 121 mg/dL — ABNORMAL HIGH (ref 70–99)
Potassium: 4.7 mEq/L (ref 3.5–5.1)
Sodium: 134 mEq/L — ABNORMAL LOW (ref 135–145)

## 2011-05-05 LAB — POCT INR: INR: 2.4

## 2011-05-05 MED ORDER — LISINOPRIL 5 MG PO TABS: 10.0000 mg | ORAL_TABLET | Freq: Every day | ORAL | Status: DC

## 2011-05-05 NOTE — Assessment & Plan Note (Signed)
Cessation is encouraged

## 2011-05-05 NOTE — Patient Instructions (Addendum)
Smoking cessation is advised.   Minimize your salt.   Continue with your current medicines. We are going to increase the Lisinopril to 10 mg (2 of the 5mg  tabs) each day.  We are going to arrange for a stress test. This is to look for blocked arteries that could be causing this weakness of your heart muscle.   We will see you back in about 3 weeks. Will be planning for cardioversion after that visit.  Call the [facility] office at [phone] if you have any questions, problems or concerns.

## 2011-05-05 NOTE — Progress Notes (Signed)
Darryl [name] Date of Birth: February 09, 1946 Medical Record #[id number]  History of Present Illness: Darryl [name] is seen back today for a post hospital visit. He is seen for Dr. Elease Hashimoto. He is a 66 year old male who presented with DOE. Noted to be in atrial fib. Has low EF at 20 to 25%. He failed cardioversion, even with ibutilide infusion. He is on amiodarone and coumadin. Will need up titration of his medicines. Will need attempt at repeat cardioversion in about a month. He did not have an ischemic workup while hospitalized and his cardiomyopathy is felt to be nonischemic in the setting of atrial fib. Did have some mild renal insufficiency and will need a repeat BMET. Has not had his INR checked since discharge that I can tell.   He comes in today. He is feeling better. He denies being short of breath. No chest pain. No swelling. No PND/orthopnea. Not lightheaded or dizzy. No excessive bruising. No bleeding reported. Did try to go back to work this past Monday but they sent him home because he did not have a note. 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   Current Outpatient Prescriptions on File Prior to Visit  Medication Sig Dispense Refill  . amiodarone (PACERONE) 200 MG tablet Take 1 tablet (200 mg total) by mouth 2 (two) times daily.  60 tablet  3  . digoxin (LANOXIN) 0.125 MG tablet Take 1 tablet (0.125 mg total) by mouth daily.  30 tablet  6  . fluticasone (FLONASE) 50 MCG/ACT nasal spray Place 2 sprays into the nose daily.  16 g  2  . furosemide (LASIX) 40 MG tablet Take 1 tablet (40 mg total) by mouth daily.  30 tablet  6  . lisinopril (PRINIVIL,ZESTRIL) 5 MG tablet Take 1 tablet (5 mg total) by mouth daily.  30 tablet  6  . metoprolol succinate (TOPROL-XL) 50 MG 24 hr tablet Take 1 tablet (50 mg total) by mouth 2 (two) times daily. Take with or immediately following a meal.  60 tablet  6  . nicotine (NICODERM CQ - DOSED IN  MG/24 HOURS) 21 mg/24hr patch Place 1 patch onto the skin daily.  28 patch    . potassium chloride (K-DUR,KLOR-CON) 10 MEQ tablet Take 1 tablet (10 mEq total) by mouth daily.  30 tablet  6  . warfarin (COUMADIN) 5 MG tablet Take 1 tablet (5 mg total) by mouth at bedtime.  30 tablet  2    No Known Allergies  Past Medical History  Diagnosis Date  . Sinusitis     a.  s/p sinus surgery in past  . Tobacco abuse     a.  47 yrs 1/2-1ppd - currently 1/2 ppd.  . Atrial fibrillation     a.  Dx 04/22/2011;  b. 04/26/11 failed DCCV (TEE w/o clot);  c. Ibutilide Facilitated CV - in and out of fib.  [name] History of drug abuse     Smoked marijuana 1-2/ week up until about a year ago. Snorted and/or smoke cocaine  approximately 1/month until 5 years ago  . 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 03/2011 echo EF 20-35%, Glob HK  . Acute  systolic heart failure     a. 09/8293  . Renal insufficiency   . Anticoagulated on Coumadin     a.  initiated 03/2011    Past Surgical History  Procedure Date  . Nasal septum  surgery     30 YRS AGO  . Tee without cardioversion 04/26/2011    Procedure: TRANSESOPHAGEAL ECHOCARDIOGRAM (TEE);  Surgeon: [name], MD;  Location: [facility] ENDOSCOPY;  Service: Cardiovascular;  Laterality: N/A;  . Cardioversion 04/26/2011    Procedure: CARDIOVERSION;  Surgeon: [name], MD;  Location: [facility] ENDOSCOPY;  Service: Cardiovascular;  Laterality: N/A;    History  Smoking status  . Current Everyday Smoker -- 0.5 packs/day for 45 years  Smokeless tobacco  . Not on file    History  Alcohol Use No    Family History  Problem Relation Age of Onset  . Lung cancer Mother     died from CA at age 34  . Lung cancer Father     died from CA at age 78  . Coronary artery disease Father 79    Pt reports his father had a "balloon placed through his arteries."  . Malignant hyperthermia Neg Hx     Review of Systems: The review of systems is per the HPI.  All other systems were  reviewed and are negative.  Physical Exam: There were no vitals taken for this visit. Patient is pleasant and in no acute distress. He looks older than his stated age. Skin is warm and dry. Color is normal.  HEENT is unremarkable. Normocephalic/atraumatic. PERRL. Sclera are nonicteric. Neck is supple. No masses. No JVD. Lungs are clear. Cardiac exam shows an irregular rhythm. Rate is controlled. Heart tones are distant. Abdomen is soft. Extremities are without edema. Gait and ROM are intact. No gross neurologic deficits noted.   LABORATORY DATA: PENDING   Assessment / Plan:

## 2011-05-05 NOTE — Assessment & Plan Note (Signed)
Looks compensated today. Has no complaints. Will try to increase the Lisinopril to 10 mg each day. No other change in his medicines. Needs to minimize his salt. I will see him back in 3 weeks and hopefully arrange for cardioversion. We will arrange for myoview in the interim. I have given him the ok to return to work. 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 Patient is agreeable to this plan and will call if any problems develop in the interim.

## 2011-05-05 NOTE — Assessment & Plan Note (Signed)
His rate is controlled on exam today. He remains on amiodarone. Will be planning for attempt at repeat cardioversion in about 4 weeks after sufficient anticoagulation.

## 2011-05-05 NOTE — Assessment & Plan Note (Signed)
Rechecking labs today.

## 2011-05-12 ENCOUNTER — Encounter: Payer: BC Managed Care – PPO | Admitting: *Deleted

## 2011-05-13 ENCOUNTER — Ambulatory Visit (INDEPENDENT_AMBULATORY_CARE_PROVIDER_SITE_OTHER): Payer: BC Managed Care – PPO

## 2011-05-13 ENCOUNTER — Ambulatory Visit (HOSPITAL_COMMUNITY): Payer: BC Managed Care – PPO | Attending: Cardiology | Admitting: Radiology

## 2011-05-13 DIAGNOSIS — I428 Other cardiomyopathies: Secondary | ICD-10-CM | POA: Insufficient documentation

## 2011-05-13 DIAGNOSIS — I4891 Unspecified atrial fibrillation: Secondary | ICD-10-CM | POA: Insufficient documentation

## 2011-05-13 DIAGNOSIS — R0609 Other forms of dyspnea: Secondary | ICD-10-CM | POA: Insufficient documentation

## 2011-05-13 DIAGNOSIS — Z8249 Family history of ischemic heart disease and other diseases of the circulatory system: Secondary | ICD-10-CM | POA: Insufficient documentation

## 2011-05-13 DIAGNOSIS — F17200 Nicotine dependence, unspecified, uncomplicated: Secondary | ICD-10-CM | POA: Insufficient documentation

## 2011-05-13 DIAGNOSIS — R0602 Shortness of breath: Secondary | ICD-10-CM

## 2011-05-13 DIAGNOSIS — E669 Obesity, unspecified: Secondary | ICD-10-CM | POA: Insufficient documentation

## 2011-05-13 DIAGNOSIS — Z7901 Long term (current) use of anticoagulants: Secondary | ICD-10-CM

## 2011-05-13 DIAGNOSIS — R5381 Other malaise: Secondary | ICD-10-CM | POA: Insufficient documentation

## 2011-05-13 DIAGNOSIS — I10 Essential (primary) hypertension: Secondary | ICD-10-CM | POA: Insufficient documentation

## 2011-05-13 DIAGNOSIS — R0989 Other specified symptoms and signs involving the circulatory and respiratory systems: Secondary | ICD-10-CM | POA: Insufficient documentation

## 2011-05-13 MED ORDER — TECHNETIUM TC 99M TETROFOSMIN IV KIT
11.0000 | PACK | Freq: Once | INTRAVENOUS | Status: AC | PRN
  Administered 2011-05-13: 11 via INTRAVENOUS

## 2011-05-13 MED ORDER — TECHNETIUM TC 99M TETROFOSMIN IV KIT
33.0000 | PACK | Freq: Once | INTRAVENOUS | Status: AC | PRN
  Administered 2011-05-13: 33 via INTRAVENOUS

## 2011-05-13 MED ORDER — REGADENOSON 0.4 MG/5ML IV SOLN
0.4000 mg | Freq: Once | INTRAVENOUS | Status: AC
  Administered 2011-05-13: 0.4 mg via INTRAVENOUS

## 2011-05-13 NOTE — Progress Notes (Signed)
[facility] SITE 3 NUCLEAR MED [address] [place] [address] [phone]  Cardiology Nuclear Med Sarita Bottom  Darryl [name] is a 66 y.o. male [id number] Jul 28, 1945   Nuclear Med Background Indication for Stress Test:  Evaluation for Ischemia; Post Hospital on 04/22/11 for New A-Fib with RVR, (-) Enzymes and Pending Repeat Cardioversion History:  04/23/11 Echo:EF=25-30%, global HK; 04/26/11 Failed Cardioversion Cardiac Risk Factors: Family History - CAD, Hypertension, Obesity and Smoker  Symptoms:  DOE/SOB and Fatigue    Nuclear Pre-Procedure Caffeine/Decaff Intake:  None NPO After: 8:00pm   Lungs:  Clear.  O2 SAT 98% on RA IV 0.9% NS with Angio Cath:  20g  IV Site: R Hand  IV Started by:  Bonnita Levan, RN  Chest Size (in):  48 Cup Size: n/a  Height: 6\' 2"  (1.88 m)  Weight:  244 lb (110.678 kg)  BMI:  Body mass index is 31.33 kg/(m^2). Tech Comments:  Patient  Toprol not held, per pt.; all medications taken this am.    Nuclear Med Study 1 or 2 day study: 1 day  Stress Test Type:  Lexiscan  Reading MD: Willa Rough, MD  Order Authorizing Provider:  Kristeen Miss, MD  Resting Radionuclide: Technetium 40m Tetrofosmin  Resting Radionuclide Dose: 11.0 mCi   Stress Radionuclide:  Technetium 50m Tetrofosmin  Stress Radionuclide Dose: 33.0 mCi           Stress Protocol Rest HR: 86 Stress HR: 88  Rest BP: Sitting 108/82  Standing 90/77 Stress BP: 88/73  Exercise Time (min): 2:00 METS: n/a   Predicted Max HR: 157 bpm % Max HR: 66.24 bpm Rate Pressure Product: 9152   Dose of Adenosine (mg):  n/a Dose of Lexiscan: 0.4 mg  Dose of Atropine (mg): n/a Dose of Dobutamine: n/a mcg/kg/min (at max HR)  Stress Test Technologist: Smiley Houseman, CMA-N  Nuclear Technologist:  Doyne Keel, CNMT     Rest Procedure:  Myocardial perfusion imaging was performed at rest 45 minutes following the intravenous administration of Technetium 29m Tetrofosmin.  Rest ECG: Atrial  Fibrilliation  Stress Procedure:  The patient received IV Lexiscan 0.4 mg over 15-seconds with concurrent low level exercise and then Technetium 24m Tetrofosmin was injected at 30-seconds while the patient continued walking one more minute.  There were no significant changes with Lexiscan.  He did have an asymptomatic hypotensive response to [employer].  Quantitative spect images were obtained after a 45-minute delay.  Stress ECG: No significant change from baseline ECG  QPS Raw Data Images:  Patient motion noted; appropriate software correction applied. Stress Images:  There is decreased activity at the apical  cap. There is decreased activity in the entire inferior wall from the base to the apex. Rest Images:  The images are very similar to the stress images. Subtraction (SDS):  There is no significant ischemia. Transient Ischemic Dilatation (Normal <1.22):  0.92 Lung/Heart Ratio (Normal <0.45):  0.34  Quantitative Gated Spect Images QGS EDV:  134 ml QGS ESV:  77 ml QGS cine images:  There is global hypokinesis. The septum and inferior walls move less well than the other walls. QGS EF: 43%  Impression Exercise Capacity:  Lexiscan with low level exercise. BP Response:  Normal blood pressure response. Clinical Symptoms:  SOB ECG Impression:  No significant ST segment change suggestive of ischemia. Comparison with Prior Nuclear Study: No previous nuclear study performed  Overall Impression:  The decreased activity at the apical cap could be from apical thinning or scar  in the apical cap. Decreased activity in the inferior wall is either from some scar or diaphragmatic attenuation. There is no significant ischemia. There is decreased wall motion.  Willa Rough, MD

## 2011-05-19 ENCOUNTER — Encounter (HOSPITAL_COMMUNITY): Payer: Self-pay | Admitting: Pharmacy Technician

## 2011-05-19 ENCOUNTER — Other Ambulatory Visit: Payer: Self-pay | Admitting: Nurse Practitioner

## 2011-05-19 ENCOUNTER — Encounter: Payer: Self-pay | Admitting: Nurse Practitioner

## 2011-05-19 ENCOUNTER — Ambulatory Visit (INDEPENDENT_AMBULATORY_CARE_PROVIDER_SITE_OTHER): Payer: BC Managed Care – PPO | Admitting: Nurse Practitioner

## 2011-05-19 ENCOUNTER — Ambulatory Visit (INDEPENDENT_AMBULATORY_CARE_PROVIDER_SITE_OTHER): Payer: BC Managed Care – PPO | Admitting: Pharmacist

## 2011-05-19 VITALS — BP 82/68 | HR 89 | Ht 74.0 in | Wt 244.2 lb

## 2011-05-19 DIAGNOSIS — I428 Other cardiomyopathies: Secondary | ICD-10-CM

## 2011-05-19 DIAGNOSIS — F17200 Nicotine dependence, unspecified, uncomplicated: Secondary | ICD-10-CM

## 2011-05-19 DIAGNOSIS — I4891 Unspecified atrial fibrillation: Secondary | ICD-10-CM

## 2011-05-19 DIAGNOSIS — Z01818 Encounter for other preprocedural examination: Secondary | ICD-10-CM

## 2011-05-19 DIAGNOSIS — Z720 Tobacco use: Secondary | ICD-10-CM

## 2011-05-19 DIAGNOSIS — Z7901 Long term (current) use of anticoagulants: Secondary | ICD-10-CM

## 2011-05-19 DIAGNOSIS — IMO0002 Reserved for concepts with insufficient information to code with codable children: Secondary | ICD-10-CM

## 2011-05-19 LAB — BASIC METABOLIC PANEL
BUN: 24 mg/dL — ABNORMAL HIGH (ref 6–23)
CO2: 26 mEq/L (ref 19–32)
Calcium: 9.7 mg/dL (ref 8.4–10.5)
Chloride: 100 mEq/L (ref 96–112)
Creatinine, Ser: 1.4 mg/dL (ref 0.4–1.5)
GFR: 53.49 mL/min — ABNORMAL LOW (ref >60.00)
Glucose, Bld: 120 mg/dL — ABNORMAL HIGH (ref 70–99)
Potassium: 4.1 mEq/L (ref 3.5–5.1)
Sodium: 137 mEq/L (ref 135–145)

## 2011-05-19 LAB — CBC WITH DIFFERENTIAL/PLATELET
Basophils Absolute: 0 10*3/uL (ref 0.0–0.1)
Basophils Relative: 0.4 % (ref 0.0–3.0)
Eosinophils Absolute: 0.1 10*3/uL (ref 0.0–0.7)
Eosinophils Relative: 0.8 % (ref 0.0–5.0)
HCT: 50.2 % (ref 39.0–52.0)
Hemoglobin: 17.4 g/dL — ABNORMAL HIGH (ref 13.0–17.0)
Lymphocytes Relative: 20.5 % (ref 12.0–46.0)
Lymphs Abs: 2.2 10*3/uL (ref 0.7–4.0)
MCHC: 34.6 g/dL (ref 30.0–36.0)
MCV: 87.3 fl (ref 78.0–100.0)
Monocytes Absolute: 0.7 10*3/uL (ref 0.1–1.0)
Monocytes Relative: 6.3 % (ref 3.0–12.0)
Neutro Abs: 7.5 10*3/uL (ref 1.4–7.7)
Neutrophils Relative %: 72 % (ref 43.0–77.0)
Platelets: 289 10*3/uL (ref 150.0–400.0)
RBC: 5.74 Mil/uL (ref 4.22–5.81)
RDW: 14.2 % (ref 11.5–14.6)
WBC: 10.5 10*3/uL (ref 4.5–10.5)

## 2011-05-19 LAB — APTT: aPTT: 47.1 s — ABNORMAL HIGH (ref 21.7–28.8)

## 2011-05-19 LAB — POCT INR: INR: 3.4

## 2011-05-19 MED ORDER — SODIUM CHLORIDE 0.9 % IV SOLN: 250.0000 mL | INTRAVENOUS | Status: DC

## 2011-05-19 MED ORDER — SODIUM CHLORIDE 0.9 % IJ SOLN: 3.0000 mL | Freq: Two times a day (BID) | INTRAMUSCULAR | Status: DC

## 2011-05-19 MED ORDER — SODIUM CHLORIDE 0.9 % IJ SOLN: 3.0000 mL | INTRAMUSCULAR | Status: DC | PRN

## 2011-05-19 MED ORDER — HYDROCORTISONE 1 % EX CREA: 1.0000 "application " | TOPICAL_CREAM | Freq: Three times a day (TID) | CUTANEOUS | Status: DC | PRN

## 2011-05-19 MED ORDER — DEXTROSE-NACL 5-0.45 % IV SOLN: INTRAVENOUS | Status: DC

## 2011-05-19 NOTE — Assessment & Plan Note (Signed)
Cessation is encouraged

## 2011-05-19 NOTE — Patient Instructions (Signed)
Stay on your same medicines.  We will check your coumadin and other labs today.  We are going to arrange for a cardioversion on Monday.  You are scheduled for a cardioversion on Monday, Feb 25th at 1:00 pm with Dr. Shirlee Latch or associates. Please go to [facility] 2nd Floor Short Stay at 11 am.  Do not have any food or drink after midnight on Sunday.  You may take your medicines with a sip of water on the day of your procedure.  You will need someone to drive you home following your procedure.   We will see you back in 2 1/2 weeks.  Call the [facility] office at [phone] if you have any questions, problems or concerns.     Electrical Cardioversion Cardioversion is the delivery of a jolt of electricity to change the rhythm of the heart. Sticky patches or metal paddles are placed on the chest to deliver the electricity from a special device. This is done to restore a normal rhythm. A rhythm that is too fast or not regular keeps the heart from pumping well. Compared to medicines used to change an abnormal rhythm, cardioversion is faster and works better. It is also unpleasant and may dislodge blood clots from the heart. WHEN WOULD THIS BE DONE?  In an emergency:   There is low or no blood pressure as a result of the heart rhythm.   Normal rhythm must be restored as fast as possible to protect the brain and heart from further damage.   It may save a life.   For less serious heart rhythms, such as atrial fibrillation or flutter, in which:   The heart is beating too fast or is not regular.   The heart is still able to pump enough blood, but not as well as it should.   Medicine to change the rhythm has not worked.   It is safe to wait in order to allow time for preparation.  LET YOUR CAREGIVER KNOW ABOUT:   Every medicine you are taking. It is very important to do this! Know when to take or stop taking any of them.   Any time in the past that you have felt your heart  was not beating normally.  RISKS AND COMPLICATIONS   Clots may form in the chambers of the heart if it is beating too fast. These clots may be dislodged during the procedure and travel to other parts of the body.   There is risk of a stroke during and after the procedure if a clot moves. Blood thinners lower this risk.   You may have a special test of your heart (TEE) to make sure there are no clots in your heart.  BEFORE THE PROCEDURE   You may have some tests to see how well your heart is working.   You may start taking blood thinners so your blood does not clot as easily.   Other drugs may be given to help your heart work better.  PROCEDURE (SCHEDULED)  The procedure is typically done in a hospital by a heart doctor (cardiologist).   You will be told when and where to go.   You may be given some medicine through an intravenous (IV) access to reduce discomfort and make you sleepy before the procedure.   Your whole body may move when the shock is delivered. Your chest may feel sore.   You may be able to go home after a few hours. Your heart rhythm will  be watched to make sure it does not change.  HOME CARE INSTRUCTIONS   Only take medicine as directed by your caregiver. Be sure you understand how and when to take your medicine.   Learn how to feel your pulse and check it often.   Limit your activity for 48 hours.   Avoid caffeine and other stimulants as directed.  SEEK MEDICAL CARE IF:   You feel like your heart is beating too fast or your pulse is not regular.   You have any questions about your medicines.   You have bleeding that will not stop.  SEEK IMMEDIATE MEDICAL CARE IF:   You are dizzy or feel faint.   It is hard to breathe or you feel short of breath.   There is a change in discomfort in your chest.   Your speech is slurred or you have trouble moving your arm or leg on one side.   You get a muscle cramp.   Your fingers or toes turn cold or blue.  MAKE  SURE YOU:   Understand these instructions.   Will watch your condition.   Will get help right away if you are not doing well or get worse.  Document Released: 03/05/2002 Document Revised: 11/25/2010 Document Reviewed: 07/05/2007 [facility] Patient Information 2012 [place], [place].

## 2011-05-19 NOTE — Assessment & Plan Note (Signed)
No problems with his coumadin noted.

## 2011-05-19 NOTE — Progress Notes (Signed)
 Darryl [name] Date of Birth: 10/31/1945 Medical Record #[id number]  History of Present Illness: Darryl [name] is seen back today for his 3 week check. He is seen for Dr. Nahser. He has atrial fib and a nonischemic cardiomyopathy. EF is 20 to 25%. Failed cardioversion, even with ibutilide infusion. Now on amiodarone along with his coumadin. He has had recent Myoview which Dr. Nahser has reviewed. It is felt to be ok. EF has improved to 43%. The plan was for repeat cardioversion after 4 weeks of anticoagulation and amiodarone therapy.   He comes in today. He is feeling very well. Has no complaint. Says he is not short of breath. No swelling. He is taking his medicines. Doesn't seem to be too aware of his atrial fib. Not dizzy or lightheaded. No chest pain. Has gone back to work without any problems. He is still smoking.   Current Outpatient Prescriptions on File Prior to Visit  Medication Sig Dispense Refill  . amiodarone (PACERONE) 200 MG tablet Take 1 tablet (200 mg total) by mouth 2 (two) times daily.  60 tablet  3  . digoxin (LANOXIN) 0.125 MG tablet Take 1 tablet (0.125 mg total) by mouth daily.  30 tablet  6  . fluticasone (FLONASE) 50 MCG/ACT nasal spray Place 2 sprays into the nose daily.  16 g  2  . furosemide (LASIX) 40 MG tablet Take 1 tablet (40 mg total) by mouth daily.  30 tablet  6  . lisinopril (PRINIVIL,ZESTRIL) 5 MG tablet Take 2 tablets (10 mg total) by mouth daily.  60 tablet  6  . metoprolol succinate (TOPROL-XL) 50 MG 24 hr tablet Take 1 tablet (50 mg total) by mouth 2 (two) times daily. Take with or immediately following a meal.  60 tablet  6  . potassium chloride (K-DUR,KLOR-CON) 10 MEQ tablet Take 1 tablet (10 mEq total) by mouth daily.  30 tablet  6  . warfarin (COUMADIN) 5 MG tablet Take 1 tablet (5 mg total) by mouth at bedtime.  30 tablet  2    No Known Allergies  Past Medical History  Diagnosis Date  . Sinusitis     a.  s/p sinus surgery in past  . Tobacco  abuse     a.  45 yrs 1/2-1ppd - currently 1/2 ppd.  . Atrial fibrillation     a.  Dx 04/22/2011;  b. 04/26/11 failed DCCV (TEE w/o clot);  c. Ibutilide Facilitated CV - in and out of fib.  . History of drug abuse     Smoked marijuana 1-2/ week up until about a year ago. Snorted and/or smoke cocaine  approximately 1/month until 5 years ago  . 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 03/2011 echo EF 20-35%, Glob HK  . Acute  systolic heart failure     a. 03/2011  . Renal insufficiency   . Anticoagulated on Coumadin     a.  initiated 03/2011  . LV dysfunction     EF is 20-35%; Increased to 43% on myoview 2/13    Past Surgical History  Procedure Date  . Nasal septum surgery     30 YRS AGO  . Tee without cardioversion 04/26/2011    Procedure: TRANSESOPHAGEAL ECHOCARDIOGRAM (TEE);  Surgeon: Dalton McLean, MD;  Location: [facility] ENDOSCOPY;  Service: Cardiovascular;  Laterality: N/A;  . Cardioversion 04/26/2011    Procedure: CARDIOVERSION;  Surgeon: Dalton McLean, MD;  Location: [facility] ENDOSCOPY;  Service: Cardiovascular;  Laterality: N/A;    History  Smoking   status  . Current Everyday Smoker -- 0.5 packs/day for 45 years  Smokeless tobacco  . Not on file    History  Alcohol Use No    Family History  Problem Relation Age of Onset  . Lung cancer Mother     died from CA at age 67  . Lung cancer Father     died from CA at age 75  . Coronary artery disease Father 62    Pt reports his father had a "balloon placed through his arteries."  . Malignant hyperthermia Neg Hx     Review of Systems: The review of systems is per the HPI.  All other systems were reviewed and are negative.  Physical Exam: BP 82/68  Pulse 89  Ht 6' 2" (1.88 m)  Wt 244 lb 3.2 oz (110.768 kg)  BMI 31.35 kg/m2 Patient is very pleasant and in no acute distress. Skin is warm and dry. Color is normal.  HEENT is unremarkable. Normocephalic/atraumatic. PERRL. Sclera are nonicteric. Neck is supple. No masses. No JVD.  Lungs are coarse. Cardiac exam shows an irregular rhythm. Rate is ok. Abdomen is soft. Extremities are without edema. Gait and ROM are intact. No gross neurologic deficits noted.   LABORATORY DATA: EKG today shows atrial fibrillation with a controlled ventricular response.    Assessment / Plan:  

## 2011-05-19 NOTE — Assessment & Plan Note (Signed)
His EF has improved to 43% per myoview. Will hopefully see continued improvement with restoration of sinus rhythm. His blood pressure is low and I don't think we have any more room to increase his current medicines. We will see him back in about 2 1/2 weeks for a post hospital visit.

## 2011-05-19 NOTE — Assessment & Plan Note (Signed)
Rate is controlled with his current regimen. His coumadin levels remain therapeutic. He remains on his amiodarone. Will arrange for outpatient cardioversion next week with Dr. Shirlee Latch [facility] DOD on 2/25). The procedure was reviewed with Darryl [name] and he is willing to proceed. Patient is agreeable to this plan and will call if any problems develop in the interim.

## 2011-05-20 ENCOUNTER — Other Ambulatory Visit: Payer: Self-pay | Admitting: Nurse Practitioner

## 2011-05-20 DIAGNOSIS — I4891 Unspecified atrial fibrillation: Secondary | ICD-10-CM

## 2011-05-20 MED ORDER — SODIUM CHLORIDE 0.9 % IV SOLN: 250.0000 mL | INTRAVENOUS | Status: DC

## 2011-05-20 MED ORDER — SODIUM CHLORIDE 0.9 % IJ SOLN: 3.0000 mL | INTRAMUSCULAR | Status: DC | PRN

## 2011-05-20 MED ORDER — HYDROCORTISONE 1 % EX CREA: 1.0000 "application " | TOPICAL_CREAM | Freq: Three times a day (TID) | CUTANEOUS | Status: DC | PRN

## 2011-05-20 MED ORDER — SODIUM CHLORIDE 0.9 % IJ SOLN: 3.0000 mL | Freq: Two times a day (BID) | INTRAMUSCULAR | Status: DC

## 2011-05-20 MED ORDER — DEXTROSE-NACL 5-0.45 % IV SOLN: INTRAVENOUS | Status: DC

## 2011-05-24 ENCOUNTER — Encounter (HOSPITAL_COMMUNITY)
Admission: RE | Disposition: A | Discharge status: Home / Self Care | Payer: Self-pay | Source: Ambulatory Visit | Attending: Cardiology

## 2011-05-24 ENCOUNTER — Ambulatory Visit (HOSPITAL_COMMUNITY)
Admission: RE | Admit: 2011-05-24 | Discharge: 2011-05-24 | Disposition: A | Discharge status: Home / Self Care | Payer: BC Managed Care – PPO | Source: Ambulatory Visit | Attending: Cardiology | Admitting: Cardiology

## 2011-05-24 ENCOUNTER — Ambulatory Visit (HOSPITAL_COMMUNITY): Payer: BC Managed Care – PPO | Admitting: Anesthesiology

## 2011-05-24 ENCOUNTER — Encounter (HOSPITAL_COMMUNITY): Payer: Self-pay | Admitting: Anesthesiology

## 2011-05-24 ENCOUNTER — Other Ambulatory Visit: Payer: Self-pay

## 2011-05-24 DIAGNOSIS — I4891 Unspecified atrial fibrillation: Secondary | ICD-10-CM | POA: Insufficient documentation

## 2011-05-24 DIAGNOSIS — Z7901 Long term (current) use of anticoagulants: Secondary | ICD-10-CM | POA: Insufficient documentation

## 2011-05-24 DIAGNOSIS — F17200 Nicotine dependence, unspecified, uncomplicated: Secondary | ICD-10-CM | POA: Insufficient documentation

## 2011-05-24 DIAGNOSIS — I428 Other cardiomyopathies: Secondary | ICD-10-CM | POA: Insufficient documentation

## 2011-05-24 HISTORY — PX: CARDIOVERSION: SHX1299

## 2011-05-24 LAB — PROTIME-INR: Prothrombin Time: 28 seconds — ABNORMAL HIGH (ref 11.6–15.2)

## 2011-05-24 SURGERY — CARDIOVERSION
Anesthesia: Monitor Anesthesia Care | Wound class: Clean

## 2011-05-24 MED ORDER — PROPOFOL 10 MG/ML IV EMUL
INTRAVENOUS | Status: DC | PRN
  Administered 2011-05-24: 160 mg via INTRAVENOUS

## 2011-05-24 MED ORDER — SODIUM CHLORIDE 0.9 % IJ SOLN: 3.0000 mL | Freq: Two times a day (BID) | INTRAMUSCULAR | Status: DC

## 2011-05-24 MED ORDER — SODIUM CHLORIDE 0.9 % IV SOLN
INTRAVENOUS | Status: DC | PRN
  Administered 2011-05-24: 13:00:00 via INTRAVENOUS

## 2011-05-24 MED ORDER — SODIUM CHLORIDE 0.9 % IJ SOLN: 3.0000 mL | INTRAMUSCULAR | Status: DC | PRN

## 2011-05-24 MED ORDER — SODIUM CHLORIDE 0.9 % IV SOLN: 250.0000 mL | INTRAVENOUS | Status: DC

## 2011-05-24 MED ORDER — HYDROCORTISONE 1 % EX CREA: 1.0000 "application " | TOPICAL_CREAM | Freq: Three times a day (TID) | CUTANEOUS | Status: DC | PRN

## 2011-05-24 MED ORDER — LIDOCAINE HCL (CARDIAC) 20 MG/ML IV SOLN
INTRAVENOUS | Status: DC | PRN
  Administered 2011-05-24: 50 mg via INTRAVENOUS

## 2011-05-24 NOTE — H&P (View-Only) (Signed)
Darryl [name] Date of Birth: 09-20-1945 Medical Record #[id number]  History of Present Illness: Mr. Darryl [name] is seen back today for his 3 week check. He is seen for Dr. Elease [name]. He has atrial fib and a nonischemic cardiomyopathy. EF is 20 to 25%. Failed cardioversion, even with ibutilide infusion. Now on amiodarone along with his coumadin. He has had recent Myoview which Dr. Elease [name] has reviewed. It is felt to be ok. EF has improved to 43%. The plan was for repeat cardioversion after 4 weeks of anticoagulation and amiodarone therapy.   He comes in today. He is feeling very well. Has no complaint. Says he is not short of breath. No swelling. He is taking his medicines. Doesn't seem to be too aware of his atrial fib. Not dizzy or lightheaded. No chest pain. Has gone back to work without any problems. He is still smoking.   Current Outpatient Prescriptions on File Prior to Visit  Medication Sig Dispense Refill  . amiodarone (PACERONE) 200 MG tablet Take 1 tablet (200 mg total) by mouth 2 (two) times daily.  60 tablet  3  . digoxin (LANOXIN) 0.125 MG tablet Take 1 tablet (0.125 mg total) by mouth daily.  30 tablet  6  . fluticasone (FLONASE) 50 MCG/ACT nasal spray Place 2 sprays into the nose daily.  16 g  2  . furosemide (LASIX) 40 MG tablet Take 1 tablet (40 mg total) by mouth daily.  30 tablet  6  . lisinopril (PRINIVIL,ZESTRIL) 5 MG tablet Take 2 tablets (10 mg total) by mouth daily.  60 tablet  6  . metoprolol succinate (TOPROL-XL) 50 MG 24 hr tablet Take 1 tablet (50 mg total) by mouth 2 (two) times daily. Take with or immediately following a meal.  60 tablet  6  . potassium chloride (K-DUR,KLOR-CON) 10 MEQ tablet Take 1 tablet (10 mEq total) by mouth daily.  30 tablet  6  . warfarin (COUMADIN) 5 MG tablet Take 1 tablet (5 mg total) by mouth at bedtime.  30 tablet  2    No Known Allergies  Past Medical History  Diagnosis Date  . Sinusitis     a.  s/p sinus surgery in past  . Tobacco  abuse     a.  45 yrs 1/2-1ppd - currently 1/2 ppd.  . Atrial fibrillation     a.  Dx 04/22/2011;  b. 04/26/11 failed DCCV (TEE w/o clot);  c. Ibutilide Facilitated CV - in and out of fib.  [name] History of drug abuse     Smoked marijuana 1-2/ week up until about a year ago. Snorted and/or smoke cocaine  approximately 1/month until 5 years ago  . 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 03/2011 echo EF 20-35%, Glob HK  . Acute  systolic heart failure     a. 07/6211  . Renal insufficiency   . Anticoagulated on Coumadin     a.  initiated 03/2011  . LV dysfunction     EF is 20-35%; Increased to 43% on myoview 2/13    Past Surgical History  Procedure Date  . Nasal septum surgery     30 YRS AGO  . Tee without cardioversion 04/26/2011    Procedure: TRANSESOPHAGEAL ECHOCARDIOGRAM (TEE);  Surgeon: [name], MD;  Location: [facility] ENDOSCOPY;  Service: Cardiovascular;  Laterality: N/A;  . Cardioversion 04/26/2011    Procedure: CARDIOVERSION;  Surgeon: [name], MD;  Location: [facility] ENDOSCOPY;  Service: Cardiovascular;  Laterality: N/A;    History  Smoking  status  . Current Everyday Smoker -- 0.5 packs/day for 45 years  Smokeless tobacco  . Not on file    History  Alcohol Use No    Family History  Problem Relation Age of Onset  . Lung cancer Mother     died from CA at age 54  . Lung cancer Father     died from CA at age 11  . Coronary artery disease Father 55    Pt reports his father had a "balloon placed through his arteries."  . Malignant hyperthermia Neg Hx     Review of Systems: The review of systems is per the HPI.  All other systems were reviewed and are negative.  Physical Exam: BP 82/68  Pulse 89  Ht 6\' 2"  (1.88 m)  Wt 244 lb 3.2 oz (110.768 kg)  BMI 31.35 kg/m2 Patient is very pleasant and in no acute distress. Skin is warm and dry. Color is normal.  HEENT is unremarkable. Normocephalic/atraumatic. PERRL. Sclera are nonicteric. Neck is supple. No masses. No JVD.  Lungs are coarse. Cardiac exam shows an irregular rhythm. Rate is ok. Abdomen is soft. Extremities are without edema. Gait and ROM are intact. No gross neurologic deficits noted.   LABORATORY DATA: EKG today shows atrial fibrillation with a controlled ventricular response.    Assessment / Plan:

## 2011-05-24 NOTE — Discharge Instructions (Addendum)
PATIENT INSTRUCTIONS  POST-ANESTHESIA    IMMEDIATELY FOLLOWING SURGERY:  Do not drive or operate machinery for the first twenty four hours after surgery.  Do not make any important decisions for twenty four hours after surgery or while taking narcotic pain medications or sedatives.  If you develop intractable nausea and vomiting or a severe headache please notify your doctor immediately.    FOLLOW-UP:  Please make an appointment with your surgeon as instructed. You do not need to follow up with anesthesia unless specifically instructed to do so    QUESTIONS?:  Please feel free to call your physician or the hospital operator if you have any questions, and they will be happy to assist you.       [facility] Anesthesia Department  [address]  [place]  [phone]

## 2011-05-24 NOTE — Preoperative (Signed)
Beta Blockers   Reason not to administer Beta Blockers:Not Applicable 

## 2011-05-24 NOTE — Anesthesia Postprocedure Evaluation (Signed)
  Anesthesia Post-op Note  Patient: Darryl [name]  Procedure(s) Performed: Procedure(s) (LRB): CARDIOVERSION (N/A)  Patient Location: PACU and Short Stay  Anesthesia Type: MAC  Level of Consciousness: awake, alert  and oriented  Airway and Oxygen Therapy: Patient Spontanous Breathing and Patient connected to nasal cannula oxygen  Post-op Pain: none  Post-op Assessment: Post-op Vital signs reviewed, Patient's Cardiovascular Status Stable, Respiratory Function Stable, Patent Airway and No signs of Nausea or vomiting  Post-op Vital Signs: Reviewed and stable  Complications: No apparent anesthesia complications

## 2011-05-24 NOTE — Interval H&P Note (Signed)
History and Physical Interval Note:  05/24/2011 1:03 PM  Darryl [name]  has presented today for surgery, with the diagnosis of AFIB  The various methods of treatment have been discussed with the patient and family. After consideration of risks, benefits and other options for treatment, the patient has consented to  Procedure(s) (LRB): CARDIOVERSION (N/A) as a surgical intervention .  The patients' history has been reviewed, patient examined, no change in status, stable for surgery.  I have reviewed the patients' chart and labs.  Questions were answered to the patient's satisfaction.     Letecia Arps [employer]

## 2011-05-24 NOTE — Procedures (Signed)
Electrical Cardioversion Procedure Note Darryl [name] [id number] 1945-06-08  Procedure: Electrical Cardioversion Indications:  Atrial Fibrillation.  INR has been therapeutic for at least 3 weeks, was 2.5 today.   Procedure Details Consent: Risks of procedure as well as the alternatives and risks of each were explained to the (patient/caregiver).  Consent for procedure obtained. Time Out: Verified patient identification, verified procedure, site/side was marked, verified correct patient position, special equipment/implants available, medications/allergies/relevent history reviewed, required imaging and test results available.  Performed  Patient placed on cardiac monitor, pulse oximetry, supplemental oxygen as necessary.  Sedation given: Propofol.  Pacer pads placed anterior and posterior chest.  Cardioverted 2 time(s).  After initial cardioversion to NSR, atrial fibrillation recurred after a few minutes and DCCV was repeated. Patient again converted to NSR but went back to atrial fibrillation after a few minutes.   Cardioverted at 200J x 2.  Evaluation Findings: Post procedure EKG shows: NSR initially then back to atrial fibrillation.  Complications: None Patient did tolerate procedure well.   Darryl [name] 05/24/2011, 1:10 PM

## 2011-05-24 NOTE — Transfer of Care (Signed)
Immediate Anesthesia Transfer of Care Note  Patient: Darryl [name]  Procedure(s) Performed: Procedure(s) (LRB): CARDIOVERSION (N/A)  Patient Location: PACU and Short Stay  Anesthesia Type: MAC  Level of Consciousness: awake, alert  and oriented  Airway & Oxygen Therapy: Patient Spontanous Breathing and Patient connected to nasal cannula oxygen  Post-op Assessment: Report given to PACU RN  Post vital signs: Reviewed and stable  Complications: No apparent anesthesia complications

## 2011-05-24 NOTE — Anesthesia Preprocedure Evaluation (Addendum)
Anesthesia Evaluation  Patient identified by MRN, date of birth, ID band Patient awake    Reviewed: Allergy & Precautions, H&P , NPO status , Patient's Chart, lab work & pertinent test results, reviewed documented beta blocker date and time   Airway Mallampati: I TM Distance: >3 FB Neck ROM: full    Dental  (+) Edentulous Upper and Edentulous Lower   Pulmonary pneumonia , Current Smoker,          Cardiovascular + dysrhythmias Atrial Fibrillation irregular Tachycardia    Neuro/Psych    GI/Hepatic   Endo/Other    Renal/GU      Musculoskeletal   Abdominal   Peds  Hematology   Anesthesia Other Findings   Reproductive/Obstetrics                          Anesthesia Physical Anesthesia Plan  ASA: III  Anesthesia Plan: MAC   Post-op Pain Management:    Induction: Intravenous  Airway Management Planned: Mask  Additional Equipment:   Intra-op Plan:   Post-operative Plan: Extubation in OR  Informed Consent: I have reviewed the patients History and Physical, chart, labs and discussed the procedure including the risks, benefits and alternatives for the proposed anesthesia with the patient or authorized representative who has indicated his/her understanding and acceptance.   Dental advisory given  Plan Discussed with: CRNA, Anesthesiologist and Surgeon  Anesthesia Plan Comments:         Anesthesia Quick Evaluation

## 2011-05-26 ENCOUNTER — Ambulatory Visit (INDEPENDENT_AMBULATORY_CARE_PROVIDER_SITE_OTHER): Payer: BC Managed Care – PPO | Admitting: Pharmacist

## 2011-05-26 DIAGNOSIS — I4891 Unspecified atrial fibrillation: Secondary | ICD-10-CM

## 2011-05-26 DIAGNOSIS — Z7901 Long term (current) use of anticoagulants: Secondary | ICD-10-CM

## 2011-05-27 ENCOUNTER — Encounter (HOSPITAL_COMMUNITY): Payer: Self-pay | Admitting: Cardiology

## 2011-06-04 ENCOUNTER — Ambulatory Visit (INDEPENDENT_AMBULATORY_CARE_PROVIDER_SITE_OTHER): Payer: BC Managed Care – PPO | Admitting: Nurse Practitioner

## 2011-06-04 ENCOUNTER — Encounter: Payer: Self-pay | Admitting: Nurse Practitioner

## 2011-06-04 ENCOUNTER — Ambulatory Visit (INDEPENDENT_AMBULATORY_CARE_PROVIDER_SITE_OTHER): Payer: BC Managed Care – PPO | Admitting: *Deleted

## 2011-06-04 VITALS — BP 96/70 | HR 70 | Ht 74.0 in | Wt 245.0 lb

## 2011-06-04 DIAGNOSIS — I4891 Unspecified atrial fibrillation: Secondary | ICD-10-CM

## 2011-06-04 DIAGNOSIS — Z7901 Long term (current) use of anticoagulants: Secondary | ICD-10-CM

## 2011-06-04 DIAGNOSIS — I5022 Chronic systolic (congestive) heart failure: Secondary | ICD-10-CM | POA: Insufficient documentation

## 2011-06-04 DIAGNOSIS — I509 Heart failure, unspecified: Secondary | ICD-10-CM

## 2011-06-04 LAB — POCT INR: INR: 3

## 2011-06-04 NOTE — Assessment & Plan Note (Signed)
He looks compensated. His EF has improved to 43% per recent myoview. Will keep him with his current regimen. Patient is agreeable to this plan and will call if any problems develop in the interim.

## 2011-06-04 NOTE — Progress Notes (Signed)
Darryl [name] Date of Birth: 08-24-1945 Medical Record #[id number]  History of Present Illness: Darryl [name] is seen back today for a post hospital visit. He is seen for Dr. Elease Hashimoto. He has atrial fib with a nonischemic cardiomyopathy. EF was down to 20 to 25%. He previously failed cardioversion, even with ibutilide infusion. Was put on amiodarone along with his coumadin. Myoview was obtained and his EF had improved to 43%. He has undergone repeat attempts at restoring sinus rhythm and has failed again to convert.   He comes in today. He is doing well. He is really asymptomatic. He says he is not short of breath. Not dizzy or lightheaded. Tolerating his medicines. Has no awareness of his atrial fib. No chest pain.   Current Outpatient Prescriptions on File Prior to Visit  Medication Sig Dispense Refill  . digoxin (LANOXIN) 0.125 MG tablet Take 1 tablet (0.125 mg total) by mouth daily.  30 tablet  6  . fluticasone (FLONASE) 50 MCG/ACT nasal spray Place 2 sprays into the nose daily.  16 g  2  . furosemide (LASIX) 40 MG tablet Take 1 tablet (40 mg total) by mouth daily.  30 tablet  6  . lisinopril (PRINIVIL,ZESTRIL) 5 MG tablet Take 2 tablets (10 mg total) by mouth daily.  60 tablet  6  . metoprolol succinate (TOPROL-XL) 50 MG 24 hr tablet Take 1 tablet (50 mg total) by mouth 2 (two) times daily. Take with or immediately following a meal.  60 tablet  6  . potassium chloride (K-DUR,KLOR-CON) 10 MEQ tablet Take 1 tablet (10 mEq total) by mouth daily.  30 tablet  6  . warfarin (COUMADIN) 5 MG tablet Take 2.5-5 mg by mouth at bedtime. Monday and Thursday 2.5 mg , 5 mg all other days.        No Known Allergies  Past Medical History  Diagnosis Date  . Sinusitis     a.  s/p sinus surgery in past  . Tobacco abuse     a.  45 yrs 1/2-1ppd - currently 1/2 ppd.  . Atrial fibrillation     a.  Dx 04/22/2011;  b. 04/26/11 failed DCCV (TEE w/o clot);  c. Ibutilide Facilitated CV - in and out of fib.;  failed cardioversion with amiodarone. Managed with rate control and anticoagulation  . History of drug abuse     Smoked marijuana 1-2/ week up until about a year ago. Snorted and/or smoke cocaine  approximately 1/month until 5 years ago  . 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 03/2011 echo EF 20-35%, Glob HK  . Acute  systolic heart failure     a. 06/979  . Renal insufficiency   . Anticoagulated on Coumadin     a.  initiated 03/2011  . LV dysfunction     EF is 20-35%; Increased to 43% on myoview 2/13    Past Surgical History  Procedure Date  . Nasal septum surgery     30 YRS AGO  . Tee without cardioversion 04/26/2011    Procedure: TRANSESOPHAGEAL ECHOCARDIOGRAM (TEE);  Surgeon: [name], MD;  Location: [facility] ENDOSCOPY;  Service: Cardiovascular;  Laterality: N/A;  . Cardioversion 04/26/2011    Procedure: CARDIOVERSION;  Surgeon: [name], MD;  Location: [facility] ENDOSCOPY;  Service: Cardiovascular;  Laterality: N/A;  . Cardioversion 05/24/2011    Procedure: CARDIOVERSION;  Surgeon: [name], MD;  Location: [facility] OR;  Service: Cardiovascular;  Laterality: N/A;    History  Smoking status  . Current Everyday Smoker --  0.5 packs/day for 45 years  Smokeless tobacco  . Not on file    History  Alcohol Use No    Family History  Problem Relation Age of Onset  . Lung cancer Mother     died from CA at age 42  . Lung cancer Father     died from CA at age 26  . Coronary artery disease Father 28    Pt reports his father had a "balloon placed through his arteries."  . Malignant hyperthermia Neg Hx     Review of Systems: The review of systems is per the HPI.  All other systems were reviewed and are negative.  Physical Exam: BP 96/70  Pulse 70  Ht 6\' 2"  (1.88 m)  Wt 245 lb (111.131 kg)  BMI 31.46 kg/m2 Patient is very pleasant and in no acute distress. Skin is warm and dry. Color is normal.  HEENT is unremarkable. Normocephalic/atraumatic. PERRL. Sclera are nonicteric.  Neck is supple. No masses. No JVD. Lungs are clear. Cardiac exam shows an irregular rhythm. Rate is controlled. Abdomen is soft. Extremities are without edema. Gait and ROM are intact. No gross neurologic deficits noted.   LABORATORY DATA:   Assessment / Plan:

## 2011-06-04 NOTE — Assessment & Plan Note (Signed)
He has failed cardioversion even with amiodarone. It is not felt that sinus will be restored. He is committed to long term anticoaulation. I have stopped his amiodarone. We will see him back in 3 weeks with his INR to readdress his rate control. For now, his rate is satisfactory. Patient is agreeable to this plan and will call if any problems develop in the interim.

## 2011-06-04 NOTE — Patient Instructions (Signed)
Stop the amiodarone.   Dr. Elease Hashimoto or I will see you back in 3 weeks.   Stay on your other medicines.   Watch your salt.   Try to cut back or stop smoking  Call the [facility] office at [phone] if you have any questions, problems or concerns.

## 2011-06-04 NOTE — Assessment & Plan Note (Signed)
He remains on coumadin. INR is 3.0 today. Will let the coumadin clinic know that we are stopping his amiodarone.

## 2011-06-29 ENCOUNTER — Ambulatory Visit (INDEPENDENT_AMBULATORY_CARE_PROVIDER_SITE_OTHER): Payer: BC Managed Care – PPO | Admitting: *Deleted

## 2011-06-29 ENCOUNTER — Encounter: Payer: Self-pay | Admitting: Nurse Practitioner

## 2011-06-29 ENCOUNTER — Ambulatory Visit (INDEPENDENT_AMBULATORY_CARE_PROVIDER_SITE_OTHER): Payer: BC Managed Care – PPO | Admitting: Nurse Practitioner

## 2011-06-29 VITALS — BP 82/58 | HR 66 | Ht 74.0 in | Wt 248.0 lb

## 2011-06-29 DIAGNOSIS — Z7901 Long term (current) use of anticoagulants: Secondary | ICD-10-CM

## 2011-06-29 DIAGNOSIS — F17200 Nicotine dependence, unspecified, uncomplicated: Secondary | ICD-10-CM

## 2011-06-29 DIAGNOSIS — I509 Heart failure, unspecified: Secondary | ICD-10-CM

## 2011-06-29 DIAGNOSIS — I5022 Chronic systolic (congestive) heart failure: Secondary | ICD-10-CM

## 2011-06-29 DIAGNOSIS — I4891 Unspecified atrial fibrillation: Secondary | ICD-10-CM

## 2011-06-29 DIAGNOSIS — Z720 Tobacco use: Secondary | ICD-10-CM

## 2011-06-29 DIAGNOSIS — I428 Other cardiomyopathies: Secondary | ICD-10-CM

## 2011-06-29 LAB — BASIC METABOLIC PANEL
BUN: 26 mg/dL — ABNORMAL HIGH (ref 6–23)
CO2: 27 mEq/L (ref 19–32)
Calcium: 9.3 mg/dL (ref 8.4–10.5)
Chloride: 96 mEq/L (ref 96–112)
Creatinine, Ser: 1.6 mg/dL — ABNORMAL HIGH (ref 0.4–1.5)
GFR: 47.97 mL/min — ABNORMAL LOW (ref >60.00)
Glucose, Bld: 103 mg/dL — ABNORMAL HIGH (ref 70–99)
Potassium: 3.9 mEq/L (ref 3.5–5.1)
Sodium: 134 mEq/L — ABNORMAL LOW (ref 135–145)

## 2011-06-29 LAB — POCT INR: INR: 3.3

## 2011-06-29 NOTE — Assessment & Plan Note (Signed)
Cessation is encouraged. He is not ready to quit at this time. 

## 2011-06-29 NOTE — Assessment & Plan Note (Signed)
He looks compensated. Denies symptoms. Tolerating current blood pressure. No change in his medicines today. Rechecking a BMET today. EF has improved to 43% per his myoview. Salt restriction is encouraged.

## 2011-06-29 NOTE — Progress Notes (Signed)
Darryl [name] Date of Birth: 1945-10-02 Medical Record #[id number]  History of Present Illness: Darryl [name] is seen back today for a follow up visit. It is a 3 week check. He is seen for Dr. Elease Hashimoto. He has a nonischemic CM. EF has come up to 43% per myoview. He has chronic atrial fib and has failed all attempts at restoring sinus rhythm. At his last visit, I stopped his amiodarone. He remains on chronic amiodarone.   He comes in today. He says he is doing ok. No chest pain. Not short of breath. Not dizzy or lightheaded. He is back working 3rd shift. Trying to sleep in the daytime. Weight has been stable. No swelling reported. Feels ok on his medicines.   Current Outpatient Prescriptions on File Prior to Visit  Medication Sig Dispense Refill  . digoxin (LANOXIN) 0.125 MG tablet Take 1 tablet (0.125 mg total) by mouth daily.  30 tablet  6  . fluticasone (FLONASE) 50 MCG/ACT nasal spray Place 2 sprays into the nose daily.  16 g  2  . furosemide (LASIX) 40 MG tablet Take 1 tablet (40 mg total) by mouth daily.  30 tablet  6  . lisinopril (PRINIVIL,ZESTRIL) 5 MG tablet Take 2 tablets (10 mg total) by mouth daily.  60 tablet  6  . metoprolol succinate (TOPROL-XL) 50 MG 24 hr tablet Take 1 tablet (50 mg total) by mouth 2 (two) times daily. Take with or immediately following a meal.  60 tablet  6  . potassium chloride (K-DUR,KLOR-CON) 10 MEQ tablet Take 1 tablet (10 mEq total) by mouth daily.  30 tablet  6  . warfarin (COUMADIN) 5 MG tablet Take 2.5-5 mg by mouth at bedtime. Monday and Thursday 2.5 mg , 5 mg all other days.        No Known Allergies  Past Medical History  Diagnosis Date  . Sinusitis     a.  s/p sinus surgery in past  . Tobacco abuse     a.  45 yrs 1/2-1ppd - currently 1/2 ppd.  . Atrial fibrillation     a.  Dx 04/22/2011;  b. 04/26/11 failed DCCV (TEE w/o clot);  c. Ibutilide Facilitated CV - in and out of fib.; failed cardioversion with amiodarone. Managed with rate control  and anticoagulation  . History of drug abuse     Smoked marijuana 1-2/ week up until about a year ago. Snorted and/or smoke cocaine  approximately 1/month until 5 years ago  . 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 03/2011 echo EF 20-35%, Glob HK  . Acute  systolic heart failure     a. 03/1912  . Renal insufficiency   . Anticoagulated on Coumadin     a.  initiated 03/2011  . LV dysfunction     EF is 20-35%; Increased to 43% on myoview 2/13    Past Surgical History  Procedure Date  . Nasal septum surgery     30 YRS AGO  . Tee without cardioversion 04/26/2011    Procedure: TRANSESOPHAGEAL ECHOCARDIOGRAM (TEE);  Surgeon: [name], MD;  Location: [facility] ENDOSCOPY;  Service: Cardiovascular;  Laterality: N/A;  . Cardioversion 04/26/2011    Procedure: CARDIOVERSION;  Surgeon: [name], MD;  Location: [facility] ENDOSCOPY;  Service: Cardiovascular;  Laterality: N/A;  . Cardioversion 05/24/2011    Procedure: CARDIOVERSION;  Surgeon: [name], MD;  Location: [facility] OR;  Service: Cardiovascular;  Laterality: N/A;    History  Smoking status  . Current Everyday Smoker -- 0.5  packs/day for 45 years  Smokeless tobacco  . Not on file    History  Alcohol Use No    Family History  Problem Relation Age of Onset  . Lung cancer Mother     died from CA at age 69  . Lung cancer Father     died from CA at age 21  . Coronary artery disease Father 22    Pt reports his father had a "balloon placed through his arteries."  . Malignant hyperthermia Neg Hx     Review of Systems: The review of systems is per the HPI.  All other systems were reviewed and are negative.  Physical Exam: BP 82/58  Pulse 66  Ht 6\' 2"  (1.88 m)  Wt 248 lb (112.492 kg)  BMI 31.84 kg/m2 BP 90/60 Patient is pleasant and in no acute distress.Smells of tobacco and has cigarettes in his shirt pocket.  Skin is warm and dry. Color is normal.  HEENT is unremarkable. Normocephalic/atraumatic. PERRL. Sclera are nonicteric.  Neck is supple. No masses. No JVD. Lungs are clear. Cardiac exam shows an irregular rhythm. Rate is controlled. Abdomen is soft. Extremities are without edema. Gait and ROM are intact. No gross neurologic deficits noted.  LABORATORY DATA: BMET is pending today.  Lab Results  Component Value Date   WBC 10.5 05/19/2011   HGB 17.4* 05/19/2011   HCT 50.2 05/19/2011   PLT 289.0 05/19/2011   GLUCOSE 120* 05/19/2011   NA 137 05/19/2011   K 4.1 05/19/2011   CL 100 05/19/2011   CREATININE 1.4 05/19/2011   BUN 24* 05/19/2011   CO2 26 05/19/2011   TSH 2.303 04/23/2011   INR 3.3 06/29/2011    Assessment / Plan:

## 2011-06-29 NOTE — Assessment & Plan Note (Signed)
He is now in chronic atrial fib. He has failed all attempts at restoring sinus rhythm. He is totally asymptomatic. He is committed to long term anticoagulation. INR is checked today. His rate is currently controlled. We will check a follow up BMET today. Will plan on seeing him back in about 3 months. Patient is agreeable to this plan and will call if any problems develop in the interim.

## 2011-06-29 NOTE — Patient Instructions (Signed)
Stay on your current medicines.  Try to stop smoking  We are going to check your potassium level today  We will see you in 3 months.  Call the [facility] office at [phone] if you have any questions, problems or concerns.

## 2011-07-13 ENCOUNTER — Ambulatory Visit (INDEPENDENT_AMBULATORY_CARE_PROVIDER_SITE_OTHER): Payer: BC Managed Care – PPO | Admitting: *Deleted

## 2011-07-13 DIAGNOSIS — Z7901 Long term (current) use of anticoagulants: Secondary | ICD-10-CM

## 2011-07-13 DIAGNOSIS — I4891 Unspecified atrial fibrillation: Secondary | ICD-10-CM

## 2011-07-13 LAB — POCT INR: INR: 2.6

## 2011-08-03 ENCOUNTER — Ambulatory Visit (INDEPENDENT_AMBULATORY_CARE_PROVIDER_SITE_OTHER): Payer: BC Managed Care – PPO | Admitting: Pharmacist

## 2011-08-03 DIAGNOSIS — Z7901 Long term (current) use of anticoagulants: Secondary | ICD-10-CM

## 2011-08-03 DIAGNOSIS — I4891 Unspecified atrial fibrillation: Secondary | ICD-10-CM

## 2011-08-03 LAB — POCT INR: INR: 2.8

## 2011-08-26 ENCOUNTER — Other Ambulatory Visit: Payer: Self-pay | Admitting: Nurse Practitioner

## 2011-08-31 ENCOUNTER — Ambulatory Visit (INDEPENDENT_AMBULATORY_CARE_PROVIDER_SITE_OTHER): Payer: BC Managed Care – PPO

## 2011-08-31 DIAGNOSIS — I4891 Unspecified atrial fibrillation: Secondary | ICD-10-CM

## 2011-08-31 DIAGNOSIS — Z7901 Long term (current) use of anticoagulants: Secondary | ICD-10-CM

## 2011-08-31 LAB — POCT INR: INR: 2.1

## 2011-10-12 ENCOUNTER — Ambulatory Visit: Payer: BC Managed Care – PPO | Admitting: Cardiovascular Disease

## 2011-10-13 ENCOUNTER — Ambulatory Visit (INDEPENDENT_AMBULATORY_CARE_PROVIDER_SITE_OTHER): Payer: BC Managed Care – PPO | Admitting: *Deleted

## 2011-10-13 DIAGNOSIS — I4891 Unspecified atrial fibrillation: Secondary | ICD-10-CM

## 2011-10-13 DIAGNOSIS — Z7901 Long term (current) use of anticoagulants: Secondary | ICD-10-CM

## 2011-10-13 LAB — POCT INR: INR: 1.8

## 2011-11-09 ENCOUNTER — Encounter: Payer: Self-pay | Admitting: Cardiovascular Disease

## 2011-11-09 ENCOUNTER — Ambulatory Visit (INDEPENDENT_AMBULATORY_CARE_PROVIDER_SITE_OTHER): Payer: BC Managed Care – PPO | Admitting: Cardiovascular Disease

## 2011-11-09 ENCOUNTER — Ambulatory Visit (INDEPENDENT_AMBULATORY_CARE_PROVIDER_SITE_OTHER): Payer: BC Managed Care – PPO | Admitting: Pharmacist

## 2011-11-09 VITALS — BP 100/70 | HR 93 | Ht 74.0 in | Wt 258.0 lb

## 2011-11-09 DIAGNOSIS — I4891 Unspecified atrial fibrillation: Secondary | ICD-10-CM

## 2011-11-09 DIAGNOSIS — Z7901 Long term (current) use of anticoagulants: Secondary | ICD-10-CM

## 2011-11-09 DIAGNOSIS — I428 Other cardiomyopathies: Secondary | ICD-10-CM

## 2011-11-09 LAB — POCT INR: INR: 1.8

## 2011-11-09 MED ORDER — METOPROLOL TARTRATE 50 MG PO TABS: 75.0000 mg | ORAL_TABLET | Freq: Two times a day (BID) | ORAL | Status: DC

## 2011-11-09 MED ORDER — LISINOPRIL 5 MG PO TABS: 5.0000 mg | ORAL_TABLET | Freq: Every day | ORAL | Status: DC

## 2011-11-09 NOTE — Patient Instructions (Addendum)
Your physician recommends that you schedule a follow-up appointment in: 3 months   Your physician has recommended you make the following change in your medication:   Stop toprol xl or metoprolol succinate Start metoprolol tartrate 75 mg twice a day 12 hours apart Decrease lisinopril to 5 mg a day

## 2011-11-09 NOTE — Progress Notes (Signed)
Darryl [name] Date of Birth  1946/03/02       [facility] Office [address]  [address] [place], [place]  [address]   [place], [place]  [address] [phone]     [phone]   Fax  [phone]    Fax [phone]  Problem List: 1. Congestive heart failure-nonischemic cardio myopathy 2. Atrial fibrillation-failed cardioversion in January, 2013. Also failed dofetilide therapy.  History of Present Illness: Darryl [name] is a 66 year old gentleman who is admitted to the hospital in January, 2013. He was admitted with congestive heart failure. His ejection fraction was around 30%. He was in atrial fibrillation. He failed cardioversion. He also tried dofetilide therapy but he failed to maintain sinus rhythm. Since that time he's done better. His ejection fraction is now up to 43% by Myoview study.   Current Outpatient Prescriptions on File Prior to Visit  Medication Sig Dispense Refill  . digoxin (LANOXIN) 0.125 MG tablet Take 1 tablet (0.125 mg total) by mouth daily.  30 tablet  6  . fluticasone (FLONASE) 50 MCG/ACT nasal spray Place 2 sprays into the nose daily.  16 g  2  . furosemide (LASIX) 40 MG tablet Take 1 tablet (40 mg total) by mouth daily.  30 tablet  6  . lisinopril (PRINIVIL,ZESTRIL) 5 MG tablet Take 2 tablets (10 mg total) by mouth daily.  60 tablet  6  . metoprolol succinate (TOPROL-XL) 50 MG 24 hr tablet Take 1 tablet (50 mg total) by mouth 2 (two) times daily. Take with or immediately following a meal.  60 tablet  6  . potassium chloride (K-DUR,KLOR-CON) 10 MEQ tablet Take 1 tablet (10 mEq total) by mouth daily.  30 tablet  6  . warfarin (COUMADIN) 5 MG tablet Take as directed by anticoagulation clinic  30 tablet  3    No Known Allergies  Past Medical History  Diagnosis Date  . Sinusitis     a.  s/p sinus surgery in past  . Tobacco abuse     a.  45 yrs 1/2-1ppd - currently 1/2 ppd.  . Atrial fibrillation    a.  Dx 04/22/2011;  b. 04/26/11 failed DCCV (TEE w/o clot);  c. Ibutilide Facilitated CV - in and out of fib.; failed cardioversion with amiodarone. Managed with rate control and anticoagulation  . History of drug abuse     Smoked marijuana 1-2/ week up until about a year ago. Snorted and/or smoke cocaine  approximately 1/month until 5 years ago  . 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 03/2011 echo EF 20-35%, Glob HK  . Acute  systolic heart failure     a. 08/628  . Renal insufficiency   . Anticoagulated on Coumadin     a.  initiated 03/2011  . LV dysfunction     EF is 20-35%; Increased to 43% on myoview 2/13    Past Surgical History  Procedure Date  . Nasal septum surgery     30 YRS AGO  . Tee without cardioversion 04/26/2011    Procedure: TRANSESOPHAGEAL ECHOCARDIOGRAM (TEE);  Surgeon: [name], MD;  Location: [facility] ENDOSCOPY;  Service: Cardiovascular;  Laterality: N/A;  . Cardioversion 04/26/2011    Procedure: CARDIOVERSION;  Surgeon: [name], MD;  Location: [facility] ENDOSCOPY;  Service: Cardiovascular;  Laterality: N/A;  . Cardioversion 05/24/2011    Procedure: CARDIOVERSION;  Surgeon: [name], MD;  Location: [facility] OR;  Service: Cardiovascular;  Laterality: N/A;  History  Smoking status  . Current Everyday Smoker -- 0.5 packs/day for 45 years  Smokeless tobacco  . Not on file    History  Alcohol Use No    Family History  Problem Relation Age of Onset  . Lung cancer Mother     died from CA at age 64  . Lung cancer Father     died from CA at age 33  . Coronary artery disease Father 22    Pt reports his father had a "balloon placed through his arteries."  . Malignant hyperthermia Neg Hx     Reviw of Systems:  Reviewed in the HPI.  All other systems are negative.  Physical Exam: Blood pressure 100/70, pulse 93, height 6\' 2"  (1.88 m), weight 258 lb (117.028 kg), SpO2 95.00%. General: Well developed, well nourished, in no acute distress.  Head:  Normocephalic, atraumatic, sclera non-icteric, mucus membranes are moist,   Neck: Supple. Carotids are 2 + without bruits. No JVD  Lungs: Clear bilaterally to auscultation.  Heart: regular rate.  normal  S1 S2. No murmurs, gallops or rubs.  Abdomen: Soft, non-tender, non-distended with normal bowel sounds. No hepatomegaly. No rebound/guarding. No masses.  Msk:  Strength and tone are normal  Extremities: No clubbing or cyanosis. No edema.  Distal pedal pulses are 2+ and equal bilaterally.  Neuro: Alert and oriented X 3. Moves all extremities spontaneously.  Psych:  Responds to questions appropriately with a normal affect.  ECG:  Assessment / Plan:

## 2011-11-09 NOTE — Assessment & Plan Note (Signed)
Hamad has chronic atrial fibrillation. His rate is still a little fast. We'll increase his metoprolol to 75 mg twice a day. Since his blood pressure is marginal we will decrease his lisinopril to 5 mg a day he does not become hypotensive.

## 2011-11-09 NOTE — Assessment & Plan Note (Signed)
His ejection fraction has improved with control his heart rate. His heart rate is still a little fast. We'll increase his metoprolol to 75 mg twice a day. His blood pressure is only marginal. We will decrease his lisinopril to 5 mg a day so that he doesn't become hypotensive. I'll see him again in 3 months for followup office visit and EKG.  He is now off amiodarone since he did not convert to sinus rhythm. I think the likelihood of converting him to sinus rhythm is very low.

## 2011-11-25 ENCOUNTER — Other Ambulatory Visit: Payer: Self-pay | Admitting: Nurse Practitioner

## 2011-11-25 NOTE — Telephone Encounter (Signed)
..   Requested Prescriptions   Pending Prescriptions Disp Refills  . furosemide (LASIX) 40 MG tablet [Pharmacy Med Name: FUROSEMIDE 40MG      TAB] 30 tablet 2    Sig: TAKE ONE TABLET BY MOUTH EVERY DAY  . digoxin (LANOXIN) 0.125 MG tablet [Pharmacy Med Name: DIGOXIN 0.125MG      TAB] 30 tablet 2    Sig: TAKE ONE TABLET BY MOUTH EVERY DAY  . KLOR-CON M10 10 MEQ tablet [Pharmacy Med Name: KLOR-CON M10        TAB] 30 each 2    Sig: TAKE ONE TABLET BY MOUTH EVERY DAY

## 2011-11-30 ENCOUNTER — Ambulatory Visit (INDEPENDENT_AMBULATORY_CARE_PROVIDER_SITE_OTHER): Payer: BC Managed Care – PPO | Admitting: Pharmacist

## 2011-11-30 DIAGNOSIS — I4891 Unspecified atrial fibrillation: Secondary | ICD-10-CM

## 2011-11-30 DIAGNOSIS — Z7901 Long term (current) use of anticoagulants: Secondary | ICD-10-CM

## 2011-11-30 LAB — POCT INR: INR: 3.1

## 2011-12-21 ENCOUNTER — Ambulatory Visit (INDEPENDENT_AMBULATORY_CARE_PROVIDER_SITE_OTHER): Payer: BC Managed Care – PPO | Admitting: *Deleted

## 2011-12-21 DIAGNOSIS — I4891 Unspecified atrial fibrillation: Secondary | ICD-10-CM

## 2011-12-21 DIAGNOSIS — Z7901 Long term (current) use of anticoagulants: Secondary | ICD-10-CM

## 2012-01-07 ENCOUNTER — Other Ambulatory Visit: Payer: Self-pay | Admitting: Cardiovascular Disease

## 2012-01-18 ENCOUNTER — Ambulatory Visit (INDEPENDENT_AMBULATORY_CARE_PROVIDER_SITE_OTHER): Payer: BC Managed Care – PPO | Admitting: *Deleted

## 2012-01-18 DIAGNOSIS — I4891 Unspecified atrial fibrillation: Secondary | ICD-10-CM

## 2012-01-18 DIAGNOSIS — Z7901 Long term (current) use of anticoagulants: Secondary | ICD-10-CM

## 2012-02-04 ENCOUNTER — Ambulatory Visit (INDEPENDENT_AMBULATORY_CARE_PROVIDER_SITE_OTHER): Payer: BC Managed Care – PPO | Admitting: Cardiovascular Disease

## 2012-02-04 ENCOUNTER — Encounter: Payer: Self-pay | Admitting: Cardiovascular Disease

## 2012-02-04 ENCOUNTER — Ambulatory Visit (INDEPENDENT_AMBULATORY_CARE_PROVIDER_SITE_OTHER): Payer: BC Managed Care – PPO | Admitting: *Deleted

## 2012-02-04 VITALS — BP 110/70 | HR 109 | Ht 74.0 in | Wt 257.8 lb

## 2012-02-04 DIAGNOSIS — I5022 Chronic systolic (congestive) heart failure: Secondary | ICD-10-CM

## 2012-02-04 DIAGNOSIS — I4891 Unspecified atrial fibrillation: Secondary | ICD-10-CM

## 2012-02-04 DIAGNOSIS — Z7901 Long term (current) use of anticoagulants: Secondary | ICD-10-CM

## 2012-02-04 DIAGNOSIS — I509 Heart failure, unspecified: Secondary | ICD-10-CM

## 2012-02-04 MED ORDER — METOPROLOL TARTRATE 100 MG PO TABS: 100.0000 mg | ORAL_TABLET | Freq: Two times a day (BID) | ORAL | Status: DC

## 2012-02-04 NOTE — Assessment & Plan Note (Signed)
Darryl [name] the seems to be doing fairly well. His heart rate still elevated. We'll increase his metoprolol to 100 mg twice a day.  He is on a low dose digoxin. His baseline creatinine is 1.6 I do not think it we can increase his digoxin any further.  He is on low-dose lisinopril and also on Lasix. I suspect that his heart failure will generally improved if we can keep his heart rate regulated. Therefore not able to keep his rate controlled and we may need to consider AV node ablation and pacemaker. I'm optimistic that his EF will continue  because of the progress that we made so far.

## 2012-02-04 NOTE — Progress Notes (Signed)
Darryl [name] Date of Birth  30-May-1945       [facility] Office [address]  [address] [place], [place]  [address]   [place], [place]  [address] [phone]     [phone]   Fax  [phone]    Fax [phone]  Problem List: 1. Congestive heart failure-nonischemic cardiomyopathy 2. Atrial fibrillation-failed cardioversion in January, 2013. Also failed dofetilide therapy.  History of Present Illness: Darryl [name] is a 66 year old gentleman who is admitted to the hospital in January, 2013. He was admitted with congestive heart failure. His ejection fraction was around 30%. He was in atrial fibrillation. He failed cardioversion.  Also stopped his amiodarone because of his inability to maintain sinus rhythm.   He also tried dofetilide therapy but he failed to maintain sinus rhythm. Since that time he's done better. His ejection fraction is now up to 43% by Myoview study.  He still does not have quite as much energy as he would like. He's breathing much better than he was previous the. He does note that the diuretics seem to be working quite well.  He denies any PND or orthopnea.   Current Outpatient Prescriptions on File Prior to Visit  Medication Sig Dispense Refill  . digoxin (LANOXIN) 0.125 MG tablet TAKE ONE TABLET BY MOUTH EVERY DAY  30 tablet  2  . fluticasone (FLONASE) 50 MCG/ACT nasal spray Place 2 sprays into the nose daily.  16 g  2  . furosemide (LASIX) 40 MG tablet TAKE ONE TABLET BY MOUTH EVERY DAY  30 tablet  2  . KLOR-CON M10 10 MEQ tablet TAKE ONE TABLET BY MOUTH EVERY DAY  30 each  2  . lisinopril (PRINIVIL,ZESTRIL) 5 MG tablet Take 1 tablet (5 mg total) by mouth daily.  30 tablet  6  . metoprolol (LOPRESSOR) 50 MG tablet Take 1.5 tablets (75 mg total) by mouth 2 (two) times daily.  45 tablet  6  . warfarin (COUMADIN) 5 MG tablet TAKE AS DIRECTED BY ANTICOAGULATION CLINIC  30 tablet  3    No Known  Allergies  Past Medical History  Diagnosis Date  . Sinusitis     a.  s/p sinus surgery in past  . Tobacco abuse     a.  45 yrs 1/2-1ppd - currently 1/2 ppd.  . Atrial fibrillation     a.  Dx 04/22/2011;  b. 04/26/11 failed DCCV (TEE w/o clot);  c. Ibutilide Facilitated CV - in and out of fib.; failed cardioversion with amiodarone. Managed with rate control and anticoagulation  . History of drug abuse     Smoked marijuana 1-2/ week up until about a year ago. Snorted and/or smoke cocaine  approximately 1/month until 5 years ago  . 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 03/2011 echo EF 20-35%, Glob HK  . Acute  systolic heart failure     a. 0/2725  . Renal insufficiency   . Anticoagulated on Coumadin     a.  initiated 03/2011  . LV dysfunction     EF is 20-35%; Increased to 43% on myoview 2/13    Past Surgical History  Procedure Date  . Nasal septum surgery     30 YRS AGO  . Tee without cardioversion 04/26/2011    Procedure: TRANSESOPHAGEAL ECHOCARDIOGRAM (TEE);  Surgeon: [name], MD;  Location: [facility] ENDOSCOPY;  Service: Cardiovascular;  Laterality: N/A;  . Cardioversion 04/26/2011    Procedure:  CARDIOVERSION;  Surgeon: [name], MD;  Location: [facility] ENDOSCOPY;  Service: Cardiovascular;  Laterality: N/A;  . Cardioversion 05/24/2011    Procedure: CARDIOVERSION;  Surgeon: [name], MD;  Location: [facility] OR;  Service: Cardiovascular;  Laterality: N/A;    History  Smoking status  . Current Every Day Smoker -- 0.5 packs/day for 45 years  Smokeless tobacco  . Not on file    History  Alcohol Use No    Family History  Problem Relation Age of Onset  . Lung cancer Mother     died from CA at age 56  . Lung cancer Father     died from CA at age 54  . Coronary artery disease Father 47    Pt reports his father had a "balloon placed through his arteries."  . Malignant hyperthermia Neg Hx     Reviw of Systems:  Reviewed in the HPI.  All other systems are  negative.  Physical Exam: Blood pressure 110/70, pulse 109, height 6\' 2"  (1.88 m), weight 257 lb 12.8 oz (116.937 kg). General: Well developed, well nourished, in no acute distress.  Head: Normocephalic, atraumatic, sclera non-icteric, mucus membranes are moist,   Neck: Supple. Carotids are 2 + without bruits. No JVD  Lungs: Clear bilaterally to auscultation.  Heart: regular rate.  normal  S1 S2. No murmurs, gallops or rubs.  Abdomen: Soft, non-tender, non-distended with normal bowel sounds. No hepatomegaly. No rebound/guarding. No masses.  Msk:  Strength and tone are normal  Extremities: No clubbing or cyanosis. No edema.  Distal pedal pulses are 2+ and equal bilaterally.  Neuro: Alert and oriented X 3. Moves all extremities spontaneously.  Psych:  Responds to questions appropriately with a normal affect.  ECG:  Assessment / Plan:

## 2012-02-04 NOTE — Patient Instructions (Addendum)
Your physician recommends that you schedule a follow-up appointment in: 3 months   Your physician has recommended you make the following change in your medication:  Increase metoprolol to 100 mg twice a day 12 hours apart.

## 2012-02-04 NOTE — Assessment & Plan Note (Signed)
His rate is still a little fast. We'll increase his metoprolol to 100 mg twice a day. He's on digoxin 0.25 mg a day. He has a baseline creatinine of 1.6 and and I do not think that we can increase his digoxin any further.

## 2012-02-22 ENCOUNTER — Other Ambulatory Visit: Payer: Self-pay | Admitting: Cardiology

## 2012-03-03 ENCOUNTER — Ambulatory Visit (INDEPENDENT_AMBULATORY_CARE_PROVIDER_SITE_OTHER): Payer: BC Managed Care – PPO | Admitting: *Deleted

## 2012-03-03 DIAGNOSIS — Z7901 Long term (current) use of anticoagulants: Secondary | ICD-10-CM

## 2012-03-03 DIAGNOSIS — I4891 Unspecified atrial fibrillation: Secondary | ICD-10-CM

## 2012-03-23 ENCOUNTER — Other Ambulatory Visit: Payer: Self-pay | Admitting: Cardiology

## 2012-04-14 ENCOUNTER — Ambulatory Visit (INDEPENDENT_AMBULATORY_CARE_PROVIDER_SITE_OTHER): Payer: BC Managed Care – PPO | Admitting: *Deleted

## 2012-04-14 DIAGNOSIS — Z7901 Long term (current) use of anticoagulants: Secondary | ICD-10-CM

## 2012-04-14 DIAGNOSIS — I4891 Unspecified atrial fibrillation: Secondary | ICD-10-CM

## 2012-05-02 ENCOUNTER — Ambulatory Visit: Payer: BC Managed Care – PPO | Admitting: Cardiovascular Disease

## 2012-05-08 ENCOUNTER — Emergency Department (HOSPITAL_COMMUNITY)
Admission: EM | Admit: 2012-05-08 | Discharge: 2012-05-08 | Disposition: A | Discharge status: Other Acute Inpatient Hospital | Payer: BC Managed Care – PPO | Source: Home / Self Care | Attending: Emergency Medicine | Admitting: Emergency Medicine

## 2012-05-08 ENCOUNTER — Emergency Department (HOSPITAL_COMMUNITY): Payer: BC Managed Care – PPO

## 2012-05-08 ENCOUNTER — Encounter (HOSPITAL_COMMUNITY): Payer: Self-pay | Admitting: Emergency Medicine

## 2012-05-08 ENCOUNTER — Inpatient Hospital Stay (HOSPITAL_COMMUNITY)
Admission: EM | Admit: 2012-05-08 | Discharge: 2012-05-13 | DRG: 566 | Disposition: A | Discharge status: Home / Self Care | Payer: BC Managed Care – PPO | Attending: Internal Medicine | Admitting: Internal Medicine

## 2012-05-08 ENCOUNTER — Encounter (HOSPITAL_COMMUNITY): Payer: Self-pay

## 2012-05-08 DIAGNOSIS — F1411 Cocaine abuse, in remission: Secondary | ICD-10-CM | POA: Diagnosis present

## 2012-05-08 DIAGNOSIS — J449 Chronic obstructive pulmonary disease, unspecified: Secondary | ICD-10-CM | POA: Diagnosis present

## 2012-05-08 DIAGNOSIS — Z801 Family history of malignant neoplasm of trachea, bronchus and lung: Secondary | ICD-10-CM

## 2012-05-08 DIAGNOSIS — E1310 Other specified diabetes mellitus with ketoacidosis without coma: Principal | ICD-10-CM | POA: Diagnosis present

## 2012-05-08 DIAGNOSIS — I517 Cardiomegaly: Secondary | ICD-10-CM | POA: Diagnosis present

## 2012-05-08 DIAGNOSIS — E119 Type 2 diabetes mellitus without complications: Secondary | ICD-10-CM | POA: Diagnosis present

## 2012-05-08 DIAGNOSIS — E872 Acidosis, unspecified: Secondary | ICD-10-CM

## 2012-05-08 DIAGNOSIS — E871 Hypo-osmolality and hyponatremia: Secondary | ICD-10-CM | POA: Diagnosis present

## 2012-05-08 DIAGNOSIS — D72829 Elevated white blood cell count, unspecified: Secondary | ICD-10-CM | POA: Diagnosis present

## 2012-05-08 DIAGNOSIS — I2789 Other specified pulmonary heart diseases: Secondary | ICD-10-CM | POA: Diagnosis present

## 2012-05-08 DIAGNOSIS — J4489 Other specified chronic obstructive pulmonary disease: Secondary | ICD-10-CM | POA: Diagnosis present

## 2012-05-08 DIAGNOSIS — Z8249 Family history of ischemic heart disease and other diseases of the circulatory system: Secondary | ICD-10-CM

## 2012-05-08 DIAGNOSIS — E875 Hyperkalemia: Secondary | ICD-10-CM | POA: Diagnosis present

## 2012-05-08 DIAGNOSIS — F1211 Cannabis abuse, in remission: Secondary | ICD-10-CM | POA: Diagnosis present

## 2012-05-08 DIAGNOSIS — I10 Essential (primary) hypertension: Secondary | ICD-10-CM

## 2012-05-08 DIAGNOSIS — I4891 Unspecified atrial fibrillation: Secondary | ICD-10-CM

## 2012-05-08 DIAGNOSIS — I509 Heart failure, unspecified: Secondary | ICD-10-CM | POA: Diagnosis present

## 2012-05-08 DIAGNOSIS — E1101 Type 2 diabetes mellitus with hyperosmolarity with coma: Secondary | ICD-10-CM

## 2012-05-08 DIAGNOSIS — E860 Dehydration: Secondary | ICD-10-CM | POA: Diagnosis present

## 2012-05-08 DIAGNOSIS — N179 Acute kidney failure, unspecified: Secondary | ICD-10-CM | POA: Diagnosis present

## 2012-05-08 DIAGNOSIS — Z79899 Other long term (current) drug therapy: Secondary | ICD-10-CM

## 2012-05-08 DIAGNOSIS — Z23 Encounter for immunization: Secondary | ICD-10-CM

## 2012-05-08 DIAGNOSIS — N182 Chronic kidney disease, stage 2 (mild): Secondary | ICD-10-CM | POA: Diagnosis present

## 2012-05-08 DIAGNOSIS — I5022 Chronic systolic (congestive) heart failure: Secondary | ICD-10-CM

## 2012-05-08 DIAGNOSIS — I2510 Atherosclerotic heart disease of native coronary artery without angina pectoris: Secondary | ICD-10-CM | POA: Diagnosis present

## 2012-05-08 DIAGNOSIS — Z7902 Long term (current) use of antithrombotics/antiplatelets: Secondary | ICD-10-CM

## 2012-05-08 DIAGNOSIS — E1110 Type 2 diabetes mellitus with ketoacidosis without coma: Secondary | ICD-10-CM

## 2012-05-08 DIAGNOSIS — I2589 Other forms of chronic ischemic heart disease: Secondary | ICD-10-CM | POA: Diagnosis present

## 2012-05-08 DIAGNOSIS — Z720 Tobacco use: Secondary | ICD-10-CM | POA: Diagnosis present

## 2012-05-08 DIAGNOSIS — R739 Hyperglycemia, unspecified: Secondary | ICD-10-CM

## 2012-05-08 DIAGNOSIS — F17200 Nicotine dependence, unspecified, uncomplicated: Secondary | ICD-10-CM | POA: Diagnosis present

## 2012-05-08 DIAGNOSIS — Z7901 Long term (current) use of anticoagulants: Secondary | ICD-10-CM

## 2012-05-08 DIAGNOSIS — E1100 Type 2 diabetes mellitus with hyperosmolarity without nonketotic hyperglycemic-hyperosmolar coma (NKHHC): Secondary | ICD-10-CM

## 2012-05-08 LAB — TROPONIN I
Troponin I: <0.3 ng/mL (ref <0.30)
Troponin I: <0.3 ng/mL (ref <0.30)

## 2012-05-08 LAB — GLUCOSE, CAPILLARY
Glucose-Capillary: 345 mg/dL — ABNORMAL HIGH (ref 70–99)
Glucose-Capillary: 280 mg/dL — ABNORMAL HIGH (ref 70–99)
Glucose-Capillary: 239 mg/dL — ABNORMAL HIGH (ref 70–99)

## 2012-05-08 LAB — MRSA PCR SCREENING: MRSA by PCR: NEGATIVE

## 2012-05-08 LAB — URINALYSIS, ROUTINE W REFLEX MICROSCOPIC
Bilirubin Urine: NEGATIVE
Hgb urine dipstick: NEGATIVE
Nitrite: NEGATIVE
Specific Gravity, Urine: 1.03 (ref 1.005–1.030)
pH: 5 (ref 5.0–8.0)

## 2012-05-08 LAB — URINE MICROSCOPIC-ADD ON

## 2012-05-08 LAB — BASIC METABOLIC PANEL
BUN: 26 mg/dL — ABNORMAL HIGH (ref 6–23)
Chloride: 80 mEq/L — ABNORMAL LOW (ref 96–112)
GFR calc Af Amer: 61 mL/min — ABNORMAL LOW (ref >90)
Glucose, Bld: 838 mg/dL (ref 70–99)
Potassium: 5.1 mEq/L (ref 3.5–5.1)
Sodium: 119 mEq/L — CL (ref 135–145)

## 2012-05-08 LAB — POCT I-STAT 3, VENOUS BLOOD GAS (G3P V)
Acid-Base Excess: 2 mmol/L (ref 0.0–2.0)
Bicarbonate: 28.4 mEq/L — ABNORMAL HIGH (ref 20.0–24.0)
O2 Saturation: 67 %
TCO2: 30 mmol/L (ref 0–100)
pCO2, Ven: 50.6 mmHg — ABNORMAL HIGH (ref 45.0–50.0)
pO2, Ven: 37 mmHg (ref 30.0–45.0)

## 2012-05-08 LAB — CBC WITH DIFFERENTIAL/PLATELET
Basophils Relative: 0 % (ref 0–1)
Eosinophils Absolute: 0 10*3/uL (ref 0.0–0.7)
HCT: 42.8 % (ref 39.0–52.0)
Hemoglobin: 15.2 g/dL (ref 13.0–17.0)
MCH: 29.3 pg (ref 26.0–34.0)
MCHC: 35.5 g/dL (ref 30.0–36.0)
MCV: 82.6 fL (ref 78.0–100.0)
Monocytes Absolute: 0.6 10*3/uL (ref 0.1–1.0)
Monocytes Relative: 6 % (ref 3–12)

## 2012-05-08 LAB — HEMOGLOBIN A1C: Hgb A1c MFr Bld: 14.5 % — ABNORMAL HIGH (ref <5.7)

## 2012-05-08 LAB — POCT I-STAT TROPONIN I: Troponin i, poc: 0 ng/mL (ref 0.00–0.08)

## 2012-05-08 LAB — RAPID URINE DRUG SCREEN, HOSP PERFORMED
Amphetamines: NOT DETECTED
Barbiturates: NOT DETECTED
Tetrahydrocannabinol: NOT DETECTED

## 2012-05-08 MED ORDER — SODIUM CHLORIDE 0.9 % IV SOLN
INTRAVENOUS | Status: DC
  Administered 2012-05-08: 4.1 [IU]/h via INTRAVENOUS
  Administered 2012-05-09: 2.1 [IU]/h via INTRAVENOUS
  Filled 2012-05-08: qty 1

## 2012-05-08 MED ORDER — SODIUM CHLORIDE 0.9 % IV SOLN
INTRAVENOUS | Status: AC
  Administered 2012-05-08 (×2): via INTRAVENOUS

## 2012-05-08 MED ORDER — WARFARIN SODIUM 5 MG PO TABS
5.0000 mg | ORAL_TABLET | Freq: Every day | ORAL | Status: DC
  Administered 2012-05-08: 5 mg via ORAL
  Filled 2012-05-08 (×2): qty 1

## 2012-05-08 MED ORDER — SODIUM CHLORIDE 0.9 % IV SOLN
1000.0000 mL | INTRAVENOUS | Status: DC
  Administered 2012-05-08: 1000 mL via INTRAVENOUS

## 2012-05-08 MED ORDER — ONDANSETRON HCL 4 MG PO TABS: 4.0000 mg | ORAL_TABLET | Freq: Four times a day (QID) | ORAL | Status: DC | PRN

## 2012-05-08 MED ORDER — SODIUM CHLORIDE 0.9 % IV BOLUS (SEPSIS): 500.0000 mL | Freq: Once | INTRAVENOUS | Status: DC

## 2012-05-08 MED ORDER — ACETAMINOPHEN 325 MG PO TABS: 650.0000 mg | ORAL_TABLET | Freq: Four times a day (QID) | ORAL | Status: DC | PRN

## 2012-05-08 MED ORDER — DEXTROSE-NACL 5-0.45 % IV SOLN: INTRAVENOUS | Status: DC

## 2012-05-08 MED ORDER — INSULIN ASPART 100 UNIT/ML ~~LOC~~ SOLN
8.0000 [IU] | Freq: Once | SUBCUTANEOUS | Status: AC
  Administered 2012-05-08: 8 [IU] via INTRAVENOUS
  Filled 2012-05-08: qty 1

## 2012-05-08 MED ORDER — INSULIN GLARGINE 100 UNIT/ML ~~LOC~~ SOLN
20.0000 [IU] | Freq: Every day | SUBCUTANEOUS | Status: DC
  Administered 2012-05-08 – 2012-05-09 (×2): 20 [IU] via SUBCUTANEOUS

## 2012-05-08 MED ORDER — DIGOXIN 0.25 MG/ML IJ SOLN
0.1250 mg | INTRAMUSCULAR | Status: AC
  Administered 2012-05-08: 0.125 mg via INTRAVENOUS
  Filled 2012-05-08: qty 0.5

## 2012-05-08 MED ORDER — DIGOXIN 125 MCG PO TABS
0.1250 mg | ORAL_TABLET | Freq: Every day | ORAL | Status: DC
  Administered 2012-05-09 – 2012-05-13 (×5): 0.125 mg via ORAL
  Filled 2012-05-08 (×6): qty 1

## 2012-05-08 MED ORDER — WARFARIN - PHARMACIST DOSING INPATIENT: Freq: Every day | Status: DC

## 2012-05-08 MED ORDER — ASPIRIN 81 MG PO CHEW
81.0000 mg | CHEWABLE_TABLET | Freq: Every day | ORAL | Status: DC
  Administered 2012-05-08 – 2012-05-10 (×3): 81 mg via ORAL
  Filled 2012-05-08 (×3): qty 1

## 2012-05-08 MED ORDER — INSULIN ASPART 100 UNIT/ML ~~LOC~~ SOLN
3.0000 [IU] | Freq: Three times a day (TID) | SUBCUTANEOUS | Status: DC
  Administered 2012-05-09: 3 [IU] via SUBCUTANEOUS

## 2012-05-08 MED ORDER — SODIUM CHLORIDE 0.9 % IV SOLN
Freq: Once | INTRAVENOUS | Status: AC
  Administered 2012-05-08: 12:00:00 via INTRAVENOUS

## 2012-05-08 MED ORDER — INSULIN ASPART 100 UNIT/ML ~~LOC~~ SOLN: 0.0000 [IU] | Freq: Every day | SUBCUTANEOUS | Status: DC

## 2012-05-08 MED ORDER — INSULIN REGULAR BOLUS VIA INFUSION
0.0000 [IU] | Freq: Three times a day (TID) | INTRAVENOUS | Status: DC
  Administered 2012-05-08: 7.4 [IU] via INTRAVENOUS
  Filled 2012-05-08: qty 10

## 2012-05-08 MED ORDER — INSULIN ASPART 100 UNIT/ML ~~LOC~~ SOLN
0.0000 [IU] | Freq: Three times a day (TID) | SUBCUTANEOUS | Status: DC
  Administered 2012-05-09: 8 [IU] via SUBCUTANEOUS

## 2012-05-08 MED ORDER — SODIUM CHLORIDE 0.9 % IV BOLUS (SEPSIS)
1000.0000 mL | Freq: Once | INTRAVENOUS | Status: AC
  Administered 2012-05-08: 1000 mL via INTRAVENOUS

## 2012-05-08 MED ORDER — SODIUM CHLORIDE 0.9 % IV SOLN
INTRAVENOUS | Status: DC
  Administered 2012-05-08: 5.4 [IU]/h via INTRAVENOUS
  Filled 2012-05-08: qty 1

## 2012-05-08 MED ORDER — SODIUM CHLORIDE 0.9 % IJ SOLN
3.0000 mL | Freq: Two times a day (BID) | INTRAMUSCULAR | Status: DC
  Administered 2012-05-08: 23:00:00 via INTRAVENOUS
  Administered 2012-05-09 – 2012-05-10 (×2): 3 mL via INTRAVENOUS

## 2012-05-08 MED ORDER — ACETAMINOPHEN 650 MG RE SUPP: 650.0000 mg | Freq: Four times a day (QID) | RECTAL | Status: DC | PRN

## 2012-05-08 MED ORDER — METOPROLOL TARTRATE 50 MG PO TABS
150.0000 mg | ORAL_TABLET | Freq: Two times a day (BID) | ORAL | Status: DC
  Filled 2012-05-08 (×3): qty 1

## 2012-05-08 MED ORDER — DEXTROSE 50 % IV SOLN: 25.0000 mL | INTRAVENOUS | Status: DC | PRN

## 2012-05-08 MED ORDER — METOPROLOL TARTRATE 50 MG PO TABS
50.0000 mg | ORAL_TABLET | ORAL | Status: AC
  Administered 2012-05-08: 50 mg via ORAL
  Filled 2012-05-08: qty 1

## 2012-05-08 MED ORDER — ONDANSETRON HCL 4 MG/2ML IJ SOLN: 4.0000 mg | Freq: Four times a day (QID) | INTRAMUSCULAR | Status: DC | PRN

## 2012-05-08 MED ORDER — METOPROLOL TARTRATE 25 MG PO TABS
50.0000 mg | ORAL_TABLET | Freq: Once | ORAL | Status: AC
  Administered 2012-05-08: 50 mg via ORAL
  Filled 2012-05-08: qty 2

## 2012-05-08 MED ORDER — POLYETHYLENE GLYCOL 3350 17 G PO PACK
17.0000 g | PACK | Freq: Every day | ORAL | Status: DC | PRN
  Filled 2012-05-08: qty 1

## 2012-05-08 NOTE — ED Notes (Signed)
Lactic acid results called to Dr. Rubin Payor

## 2012-05-08 NOTE — ED Notes (Signed)
Lab at bedside

## 2012-05-08 NOTE — Consult Note (Signed)
CARDIOLOGY CONSULT NOTE  Patient ID: Darryl [name], MRN: [id number], DOB/AGE: 08-26-1945 67 y.o. Admit date: 05/08/2012   Date of Consult: 05/08/2012 Primary Physician: [name], MD Primary Cardiologist: Nahser  Chief Complaint: dizziness Reason for Consult: afib RVR in the setting of marked hyperglycemia and electrolyte abnormalities  HPI: [name] is a 67 y/o M with history of renal insufficiency, prior drug abuse, CHF, and atrial fibrillation that has been difficult to control. His afib was diagnosed in 03/2011, at which time he failed ibutilide-assisted DCCV. Another cardioversion in 04/2011 while on amiodarone did not hold, so amio was stopped. There has been some discussion about the need for possible AV node ablation and pacemaker down the road. His EF was 25-30% in Jan 2013 felt tachymediated, with nuc shortly after showing "decreased activity at the apical cap could be from apical thinning or scar in the apical cap. Decreased activity in the inferior wall is either from some scar or diaphragmatic attenuation. There is no significant ischemia, EF 43%" He presented to Urgent Care today with complaints of weakness, dizziness and was found to be in atrial fib with RVR. He has felt poorly for the last month with decreased appetite, energy, dry mouth with subsequent increased fluid intake, and increased urination. This last week, he's felt slightly dizzy with occasional blurry vision. Per nursing note, he received 2L NS at urgent care and was transferred to the [facility] ED. He was found to have a glucose of >700, Na 120, K 6.3, Cl 87, BUN/Cr 29/1.6, UA with >1000 glu, troponin neg x 1. CXR is negative. He denies any fevers, chills, nausea, vomiting, diarrhea, recent infections, syncope, chest pain, LEE. He feels like he's lost weight with his decreased appetite -- weight is 229 today, was 257 in November 2013. Has chronic unchanged SOB with ambulation up stairs. EKG shows afib 136bpm with some new  anterolateral ST depression. He is unaware of his HR. HR is in the 120s-130s, BP was soft in 90s systolic earlier but presently at 108/77.  Past Medical History  Diagnosis Date  . Sinusitis     a.  s/p sinus surgery in past  . Tobacco abuse     a.  45+ yrs 1/2-1ppd.  . Atrial fibrillation     a.  Dx 04/22/2011;  b. 04/26/11 failed ibutilide-facilitated DCCV (TEE w/o clot);  c. S/p DCCV 04/2011 but did not maintain NSR. d. amio stopped 3/13 due to not maintaining NSR.    [name] History of drug abuse     Smoked marijuana 1-2/ week up until about a year ago. Snorted and/or smoke cocaine  approximately 1/month until 5 years ago  . 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 03/2011 echo EF 20-35%, Glob HK; myoview 04/2011 with ?attn versus scar, no significant ischemia, EF 43%.  . Acute  systolic heart failure     a. 10/4130  . Renal insufficiency   . Anticoagulated on Coumadin     a.  initiated 03/2011      Most Recent Cardiac Studies: Nuc 04/2011 Impression  Exercise Capacity: Lexiscan with low level exercise.  BP Response: Normal blood pressure response.  Clinical Symptoms: SOB  ECG Impression: No significant ST segment change suggestive of ischemia.  Comparison with Prior Nuclear Study: No previous nuclear study performed  Overall Impression: The decreased activity at the apical cap could be from apical thinning or scar in the apical cap. Decreased activity in the inferior wall is either from some scar or diaphragmatic  attenuation. There is no significant ischemia. There is decreased wall motion.  TEE 04/26/2011 Study Conclusions - Left ventricle: Systolic function was severely reduced. The estimated ejection fraction was in the range of 25% to 30%. - Aortic valve: No evidence of vegetation. - Mitral valve: Mild to moderate regurgitation. - Left atrium: No evidence of thrombus in the atrial cavity or appendage. - Tricuspid valve: No evidence of vegetation   Surgical History:  Past  Surgical History  Procedure Laterality Date  . Nasal septum surgery      30 YRS AGO  . Tee without cardioversion  04/26/2011    Procedure: TRANSESOPHAGEAL ECHOCARDIOGRAM (TEE);  Surgeon: [name], MD;  Location: [facility] ENDOSCOPY;  Service: Cardiovascular;  Laterality: N/A;  . Cardioversion  04/26/2011    Procedure: CARDIOVERSION;  Surgeon: [name], MD;  Location: [facility] ENDOSCOPY;  Service: Cardiovascular;  Laterality: N/A;  . Cardioversion  05/24/2011    Procedure: CARDIOVERSION;  Surgeon: [name], MD;  Location: [facility] OR;  Service: Cardiovascular;  Laterality: N/A;     Home Meds: Prior to Admission medications   Medication Sig Start Date End Date Taking? Authorizing Provider  digoxin (LANOXIN) 0.125 MG tablet Take 0.125 mg by mouth daily.   Yes Historical Provider, MD  furosemide (LASIX) 40 MG tablet Take 40 mg by mouth daily.   Yes Historical Provider, MD  lisinopril (PRINIVIL,ZESTRIL) 5 MG tablet Take 1 tablet (5 mg total) by mouth daily. 11/09/11 11/08/12 Yes Vesta Mixer, MD  metoprolol (LOPRESSOR) 100 MG tablet Take 1 tablet (100 mg total) by mouth 2 (two) times daily. 02/04/12 02/03/13 Yes Vesta Mixer, MD  potassium chloride (K-DUR,KLOR-CON) 10 MEQ tablet Take 10 mEq by mouth daily.   Yes Historical Provider, MD  warfarin (COUMADIN) 5 MG tablet Take 5 mg by mouth every evening.   Yes Historical Provider, MD    Inpatient Medications:   Allergies: No Known Allergies  History   Social History  . Marital Status: Married    Spouse Name: N/A    Number of Children: N/A  . Years of Education: N/A   Occupational History  . 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 Smoking status: Current Every Day Smoker -- 0.50 packs/day for 45 years  . Smokeless tobacco: Not on file  . Alcohol Use: No  . Drug Use: No     Comment: has used marijuana and cocaine in past  . Sexually Active: Not Currently   Other Topics Concern  . Not  on file   Social History Narrative   Pt reports not having seen any medical care x 10 years with exception of visit to [facility] on 04/19/11 for SOB and sinusitis. Also reports no PMH w/ exception of a doctor, "some years ago telling me that my cholesterol was high. So I stopped eating Bologna and it went back down."     Family History  Problem Relation Age of Onset  . Lung cancer Mother     died from CA at age 66  . Lung cancer Father     died from CA at age 52  . Coronary artery disease Father 37    Pt reports his father had a "balloon placed through his arteries."  . Malignant hyperthermia Neg Hx      Review of Systems: General: negative for chills, fever, night sweats.  Cardiovascular: no PND or orthopnea Dermatological: negative for rash Respiratory: has occasional cough when  his sinus drainage causes post nasal drip Urologic: negative for hematuria Abdominal: negative for nausea, vomiting, diarrhea, bright red blood per rectum, melena, or hematemesis Neurologic: negative for syncope All other systems reviewed and are otherwise negative except as noted above.  Labs: Troponin neg x 1 INR 2.09 Lactic acid 2.89 Lab Results  Component Value Date   WBC 11.5* 05/08/2012   HGB 16.7 05/08/2012   HCT 49.0 05/08/2012   MCV 82.6 05/08/2012   PLT 340 05/08/2012     Recent Labs Lab 05/08/12 1329  NA 119*  K 5.1  CL 80*  CO2 24  BUN 26*  CREATININE 1.38*  CALCIUM 9.7  GLUCOSE 838*    Radiology/Studies:  Dg Chest Port 1 View 05/08/2012  *RADIOLOGY REPORT*  Clinical Data: Dizziness and cough.  PORTABLE CHEST - 1 VIEW  Comparison: One-view chest 04/25/2011.  Findings: Heart size is normal.  The lungs are clear.  The visualized soft tissues and bony thorax are unremarkable.  IMPRESSION: Negative chest.   Original Report Authenticated By: Marin Roberts, M.D.     EKG: EKG shows afib 136bpm with some anterior ST depression. He is unaware of his HR  Physical Exam: Blood pressure  94/68, pulse 89, temperature 97.8 F (36.6 C), temperature source Axillary, resp. rate 19, weight 229 lb 1.6 oz (103.919 kg), SpO2 100.00%. General: Well developed white male in no acute distress. Head: Normocephalic, atraumatic, sclera non-icteric, no xanthomas, nares are without discharge.  Neck: Negative for carotid bruits. JVD not elevated. Lungs: diminished breath sounds throughout without rales, wheezes or rhonchi. Breathing is unlabored. Heart: Irregular, tachycardic with S1 S2. No murmurs, rubs, or gallops appreciated. Abdomen: Soft, non-tender, non-distended with normoactive bowel sounds. No hepatomegaly. No rebound/guarding. No obvious abdominal masses. Msk:  Strength and tone appear normal for age. Extremities: No clubbing or cyanosis. No edema.  Distal pedal pulses are 2+ and equal bilaterally. Neuro: Alert and oriented X 3. No facial asymmetry. No focal deficit. Moves all extremities spontaneously. Psych:  Responds to questions appropriately with a normal affect.   Assessment and Plan:   1. Hyperglycemia with subsequent electrolyte abnormalities: to be admitted and managed by by IM. Not previously known to be diabetic. 2. Known atrial fibrillation with RVR (failed multiple cardioversions, amiodarone, difficult to control rates): suspect related to #1. We will give him metoprolol 50mg  now and increase to 150mg  BID. Continue digoxin. 3. Chronic systolic CHF, due to presumed NICM EF 25-30%- euvolemic, if somewhat dry. Watch volume status carefully with fluid resuscitation as he has already received a significant amount of IVF. He does have ST depression in anterolateral leads which raises suspicion for underlying CAD, especially knowing now that he is diabetic. Discussed with Dr. Patty Sermons. Add ASA 81mg  daily. We feel he would benefit from cath to exclude ischemia as an underlying contributor to his cardiomyopathy, but would prefer to defer until after he has recovered from this acute  illness - we may re-evaluate in the office first before proceeding. However, if he significantly rules in, would hold coumadin, start heparin, and pursue cath this admission.  4. Weight loss: likely related to diabetes, but if this persists beyond glycemic control over the next few months, would need workup. 4. H/o drug abuse: UDS negative. 5. Ongoing tobacco abuse: counseled regarding cessation. 6. Chronic renal insufficiency: follow. His GFR is 53 so if we need to do cath, would do so with minimal dye usage.  Signed, Ronie Spies PA-C 05/08/2012, 2:55 PM The patient was seen in  the CDU with Dayna Dunn PA-C.  I agree with her assessment and plan as noted above.  The patient presents with a very classic history for uncontrolled diabetes with polyuria and polydipsia and significant weight loss.  He has not had any recent steroid administration nor any recent severe infections to account for his abrupt appearance of significant diabetes after years of borderline blood sugars having been previously noted.  The patient denies any chest pain to suggest angina pectoris.  His electrocardiogram does show rate related anterior wall ST segment depression.  His initial cardiac enzyme markers are normal.  He is compliant with his home medications and with his Coumadin and his INR is therapeutic.  We will plan to continue his Coumadin during this admission unless his cardiac markers rule in for an ischemic event.  If no evidence of ischemic injury and no chest pain, I would favor an outpatient ischemic evaluation with Dr. Elease Hashimoto who knows the patient well.

## 2012-05-08 NOTE — ED Notes (Signed)
X-ray at bedside

## 2012-05-08 NOTE — H&P (Addendum)
[facility] History and Physical  Darryl [name] ION:[id number] DOB: March 07, 1946 DOA: 05/08/2012  Referring physician: Dr. Rubin Payor PCP: Darryl Bucco, MD  Specialists: Dr. Melburn Popper  Chief Complaint: polyurea & polydispsia  HPI: Darryl [name] is a 67 y.o. male  67 y/o M with history of renal insufficiency, prior drug abuse, chronic kidney disease stage 3-4 chronic systolic heart failure EF was 24-40% in Jan 2013 felt tachymediated, and atrial fibrillation that has been difficult to control currently on Coumadin. He presented to Urgent Care today with complaints of weakness, dizziness and was found to be in atrial fib with RVR with polydipsia and polyuria. He has felt poorly for the last month with decreased appetite, energy.This last week, he's felt slightly dizzy with occasional blurry vision Upon standing. Per nursing note, he received 2L NS at urgent care and was transferred to the [facility] ED.   Here in the ED an EKG was done that showed A. fib with RVR, an ABG of 7.35/50/37, hyponatremia with a sodium of 119 potassium 5.1 chloride of 80 and a creatinine 1.6 with his baseline creatinine being 1.6, lactic acid of 2.8 and an increasing white blood cell count of 11.5. Blood glucose greater than 800 within an interval of 10. First set of of cardiac enzymes was done that was negative. A UA does not show any new infection, he denies any new drugs. So he was referred for admission.   Review of Systems: The patient denies anorexia, fever, weight loss,, decreased hearing, hoarseness, chest pain, syncope, dyspnea on exertion, peripheral edema, balance deficits, hemoptysis, abdominal pain, melena, hematochezia, severe indigestion/heartburn, hematuria, incontinence, genital sores, muscle weakness, suspicious skin lesions, transient blindness, difficulty walking, depression, unusual weight change, abnormal bleeding, enlarged lymph nodes, angioedema, and breast masses.    Past Medical History  Diagnosis  Date  . Sinusitis     a.  s/p sinus surgery in past  . Tobacco abuse     a.  45+ yrs 1/2-1ppd.  . Atrial fibrillation     a.  Dx 04/22/2011;  b. 04/26/11 failed ibutilide-facilitated DCCV (TEE w/o clot);  c. S/p DCCV 04/2011 but did not maintain NSR. d. amio stopped 3/13 due to not maintaining NSR.    [name] History of drug abuse     Smoked marijuana 1-2/ week up until about a year ago. Snorted and/or smoke cocaine  approximately 1/month until 5 years ago  . 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 03/2011 echo EF 20-35%, Glob HK; myoview 04/2011 with ?attn versus scar, no significant ischemia, EF 43%.  . Acute  systolic heart failure     a. 03/270  . Renal insufficiency   . Anticoagulated on Coumadin     a.  initiated 03/2011   Past Surgical History  Procedure Laterality Date  . Nasal septum surgery      30 YRS AGO  . Tee without cardioversion  04/26/2011    Procedure: TRANSESOPHAGEAL ECHOCARDIOGRAM (TEE);  Surgeon: [name], MD;  Location: [facility] ENDOSCOPY;  Service: Cardiovascular;  Laterality: N/A;  . Cardioversion  04/26/2011    Procedure: CARDIOVERSION;  Surgeon: [name], MD;  Location: [facility] ENDOSCOPY;  Service: Cardiovascular;  Laterality: N/A;  . Cardioversion  05/24/2011    Procedure: CARDIOVERSION;  Surgeon: [name], MD;  Location: [facility] OR;  Service: Cardiovascular;  Laterality: N/A;   Social History:  reports that he has been smoking Cigarettes.  He has a 22.5 pack-year smoking history. He does not have any smokeless tobacco history on file. He  reports that he does not drink alcohol or use illicit drugs. Lives at home by himself can perform all his ADLs  No Known Allergies  Family History  Problem Relation Age of Onset  . Lung cancer Mother     died from CA at age 76  . Lung cancer Father     died from CA at age 1  . Coronary artery disease Father 62    Pt reports his father had a "balloon placed through his arteries."  . Malignant hyperthermia Neg Hx     Prior  to Admission medications   Medication Sig Start Date End Date Taking? Authorizing Provider  digoxin (LANOXIN) 0.125 MG tablet Take 0.125 mg by mouth daily.   Yes Historical Provider, MD  furosemide (LASIX) 40 MG tablet Take 40 mg by mouth daily.   Yes Historical Provider, MD  lisinopril (PRINIVIL,ZESTRIL) 5 MG tablet Take 1 tablet (5 mg total) by mouth daily. 11/09/11 11/08/12 Yes Vesta Mixer, MD  metoprolol (LOPRESSOR) 100 MG tablet Take 1 tablet (100 mg total) by mouth 2 (two) times daily. 02/04/12 02/03/13 Yes Vesta Mixer, MD  potassium chloride (K-DUR,KLOR-CON) 10 MEQ tablet Take 10 mEq by mouth daily.   Yes Historical Provider, MD  warfarin (COUMADIN) 5 MG tablet Take 5 mg by mouth every evening.   Yes Historical Provider, MD   Physical Exam: Filed Vitals:   05/08/12 1430 05/08/12 1445 05/08/12 1501 05/08/12 1515  BP: 108/77 127/82  115/70  Pulse: 108 111 140 79  Temp:      TempSrc:      Resp: 17 21  19   Weight:      SpO2: 100% 100%  99%     General:  Awake alert and oriented x3 in no acute distress  Eyes: Anicteric no pallor  ENT: Dry mucous membranes poor oral hygiene  Neck: No JVD  Cardiovascular: Irregular rate tachycardic with a positive S1 and S2 appreciated no murmurs rubs gallops  Respiratory: Moderate air movement but clear to auscultation  Abdomen: Positive bowel sounds nontender nondistended and soft  Skin: No rashes or ulcerations  Musculoskeletal: Intact  Psychiatric: Appropriate  Neurologic: Awake alert and oriented x4 coherent for language and nonfocal  Labs on Admission:  Basic Metabolic Panel:  Recent Labs Lab 05/08/12 1327 05/08/12 1329  NA 120* 119*  K 6.3* 5.1  CL 87* 80*  CO2  --  24  GLUCOSE >700* 838*  BUN 29* 26*  CREATININE 1.60* 1.38*  CALCIUM  --  9.7   Liver Function Tests: No results found for this basename: AST, ALT, ALKPHOS, BILITOT, PROT, ALBUMIN,  in the last 168 hours No results found for this basename: LIPASE,  AMYLASE,  in the last 168 hours No results found for this basename: AMMONIA,  in the last 168 hours CBC:  Recent Labs Lab 05/08/12 1249 05/08/12 1327  WBC 11.5*  --   NEUTROABS 9.0*  --   HGB 15.2 16.7  HCT 42.8 49.0  MCV 82.6  --   PLT 340  --    Cardiac Enzymes:  Recent Labs Lab 05/08/12 1329  TROPONINI <0.30    BNP (last 3 results) No results found for this basename: PROBNP,  in the last 8760 hours CBG: No results found for this basename: GLUCAP,  in the last 168 hours  Radiological Exams on Admission: Dg Chest Port 1 View  05/08/2012  *RADIOLOGY REPORT*  Clinical Data: Dizziness and cough.  PORTABLE CHEST - 1 VIEW  Comparison:  One-view chest 04/25/2011.  Findings: Heart size is normal.  The lungs are clear.  The visualized soft tissues and bony thorax are unremarkable.  IMPRESSION: Negative chest.   Original Report Authenticated By: Marin Roberts, M.D.     EKG: Independently reviewed. Atrial for relation with RVR right axis deviation nonspecific T wave changes.  Assessment/Plan  Hyperosmolar non-ketotic state in patient with type 2 diabetes mellitus: - We'll go ahead and start him on IV insulin, he is a newly diagnosed diabetic. He relates no new medications. I will check a UDS. EKG does not show any signs of ischemia, initial set of cardiac enzymes was negative I will continue to cycle these, also check a TSH.  - We'll also go ahead and start Lantus at this time check hemoglobin A1c. And we'll continue aggressive fluid hydration having in mind that he does have systolic heart failure with an EF of 43%. I will place a Foley and continue to monitor strict I.'s and O.'s. We'll check CBGs q. hourly, and basic metabolic panel Q2 to 4 hours. Once his blood glucoses glucose 250, O2 ahead and start him on D5 half normal. Once his blood glucose is below 150 we can transition him off the insulin. Having in mind that Lantus have already been started.   Atrial fibrillation  with RVR: - Cardiology has been consulted. I agree that his degree of atrial fibrillation with RVR there is some component that is secondary to his decreased intravascular volume and electrolyte abnormalities. I would defer further management of his rate control to cardiology.  Lactic acid acidosis - This probably secondary to decreased intravascular volume. His blood pressures borderline soft, he does have a mild acidosis on his ABG. His anion gap is within normal. There is no signs of infection except for mild leukocytosis. He relates no fevers at home. He denies any dysuria his chest x-ray does not show any infiltrates. Is mild increase in his respiration rate is probably secondary to his acidosis.   CRF (chronic renal failure) - Currently at baseline. I will go ahead and hold his ACE inhibitor for now. - His hyperkalemia has improved with initial management in ED. This is probably 2/2 to AKI. Initial treatment with IV fluids and re-evaluate in am.   Chronic systolic congestive heart failure - Continue metoprolol defer further management to cardiology.   Hyponatremia - There is some degree of pseudohyponatremia. As his blood glucose greater than 800. I suspect that with correction of his blood glucose and IV hydration this will resolve. We'll check a basic metabolic panel in the morning.   Leukocytosis: - Is most likely stress emargination, he denies any fever, cough, shortness of breath, nausea vomiting or diarrhea which makes an infectious etiology unlikely.   Code Status: full Family Communication: none Disposition Plan: home 3-4 days (indicate anticipated LOS)  Time spent: 70 minutes  Marinda Elk [facility] Pager [phone]  If 7PM-7AM, please contact night-coverage [email or web address] Password [facility] 05/08/2012, 3:34 PM

## 2012-05-08 NOTE — ED Provider Notes (Signed)
History     CSN: [id number]  Arrival date & time 05/08/12  1200   First MD Initiated Contact with Patient 05/08/12 1212      Chief Complaint  Patient presents with  . Atrial Fibrillation  . Fatigue    (Consider location/radiation/quality/duration/timing/severity/associated sxs/prior treatment) Patient is a 67 y.o. male presenting with atrial fibrillation.  Atrial Fibrillation Associated symptoms include shortness of breath. Pertinent negatives include no chest pain, no abdominal pain and no headaches.   patient was transferred from urgent care with A. fib with RVR. Patient is a history of A. fib and he states he is always in A. fib. His been difficult to control. His been only on rate control after failed cardioversions. No chest pain. He states she's felt fatigued for the last few days. He states he was able sleep very little last night. States she always has to get up greater than 6 times a night to urinate. He states he's had decreased appetite. He states he recently had a stomach bug, but that is resolved. He's had a cough. No chest pain. His cough is nonproductive. He states he is thirsty and his mouth is dry.  Past Medical History  Diagnosis Date  . Sinusitis     a.  s/p sinus surgery in past  . Tobacco abuse     a.  45+ yrs 1/2-1ppd.  . Atrial fibrillation     a.  Dx 04/22/2011;  b. 04/26/11 failed ibutilide-facilitated DCCV (TEE w/o clot);  c. S/p DCCV 04/2011 but did not maintain NSR. d. amio stopped 3/13 due to not maintaining NSR.    [name] History of drug abuse     Smoked marijuana 1-2/ week up until about a year ago. Snorted and/or smoke cocaine  approximately 1/month until 5 years ago  . 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 03/2011 echo EF 20-35%, Glob HK; myoview 04/2011 with ?attn versus scar, no significant ischemia, EF 43%.  . Acute  systolic heart failure     a. 05/6642  . Renal insufficiency   . Anticoagulated on Coumadin     a.  initiated 03/2011    Past  Surgical History  Procedure Laterality Date  . Nasal septum surgery      30 YRS AGO  . Tee without cardioversion  04/26/2011    Procedure: TRANSESOPHAGEAL ECHOCARDIOGRAM (TEE);  Surgeon: [name], MD;  Location: [facility] ENDOSCOPY;  Service: Cardiovascular;  Laterality: N/A;  . Cardioversion  04/26/2011    Procedure: CARDIOVERSION;  Surgeon: [name], MD;  Location: [facility] ENDOSCOPY;  Service: Cardiovascular;  Laterality: N/A;  . Cardioversion  05/24/2011    Procedure: CARDIOVERSION;  Surgeon: [name], MD;  Location: [facility] OR;  Service: Cardiovascular;  Laterality: N/A;    Family History  Problem Relation Age of Onset  . Lung cancer Mother     died from CA at age 60  . Lung cancer Father     died from CA at age 34  . Coronary artery disease Father 3    Pt reports his father had a "balloon placed through his arteries."  . Malignant hyperthermia Neg Hx     History  Substance Use Topics  . Smoking status: Current Every Day Smoker -- 0.50 packs/day for 45 years    Types: Cigarettes  . Smokeless tobacco: Not on file  . Alcohol Use: No      Review of Systems  Constitutional: Positive for appetite change and fatigue. Negative for activity change.  HENT: Negative  for neck stiffness.   Eyes: Negative for pain.  Respiratory: Positive for cough and shortness of breath. Negative for chest tightness.   Cardiovascular: Negative for chest pain and leg swelling.  Gastrointestinal: Negative for nausea, vomiting, abdominal pain and diarrhea.  Genitourinary: Negative for flank pain.  Musculoskeletal: Negative for back pain.  Skin: Negative for rash.  Neurological: Positive for light-headedness. Negative for weakness, numbness and headaches.  Psychiatric/Behavioral: Negative for behavioral problems.    Allergies  Review of patient's allergies indicates no known allergies.  Home Medications   No current outpatient prescriptions on file.  BP 89/70  Pulse 85  Temp(Src) 97.4 F (36.3  C) (Oral)  Resp 22  Ht 6\' 2"  (1.88 m)  Wt 228 lb 2.8 oz (103.5 kg)  BMI 29.28 kg/m2  SpO2 97%  Physical Exam  Nursing note and vitals reviewed. Constitutional: He is oriented to person, place, and time. He appears well-developed and well-nourished.  HENT:  Head: Normocephalic and atraumatic.  Eyes: EOM are normal. Pupils are equal, round, and reactive to light.  Neck: Normal range of motion. Neck supple.  Cardiovascular: Normal heart sounds.   No murmur heard. Irregular tachycardia.  Pulmonary/Chest: Effort normal.  Mildly harsh breath sounds throughout.  Abdominal: Soft. Bowel sounds are normal. He exhibits no distension and no mass. There is no tenderness. There is no rebound and no guarding.  Musculoskeletal: Normal range of motion. He exhibits no edema.  Neurological: He is alert and oriented to person, place, and time. No cranial nerve deficit.  Skin: Skin is warm and dry.  Psychiatric: He has a normal mood and affect.    ED Course  Procedures (including critical care time)  Labs Reviewed  CBC WITH DIFFERENTIAL - Abnormal; Notable for the following:    WBC 11.5 (*)    Neutrophils Relative 78 (*)    Neutro Abs 9.0 (*)    All other components within normal limits  PROTIME-INR - Abnormal; Notable for the following:    Prothrombin Time 22.6 (*)    INR 2.09 (*)    All other components within normal limits  URINALYSIS, ROUTINE W REFLEX MICROSCOPIC - Abnormal; Notable for the following:    Glucose, UA >1000 (*)    All other components within normal limits  BASIC METABOLIC PANEL - Abnormal; Notable for the following:    Sodium 119 (*)    Chloride 80 (*)    Glucose, Bld 838 (*)    BUN 26 (*)    Creatinine, Ser 1.38 (*)    GFR calc non Af Amer 53 (*)    GFR calc Af Amer 61 (*)    All other components within normal limits  GLUCOSE, CAPILLARY - Abnormal; Notable for the following:    Glucose-Capillary 491 (*)    All other components within normal limits  GLUCOSE,  CAPILLARY - Abnormal; Notable for the following:    Glucose-Capillary 456 (*)    All other components within normal limits  POCT I-STAT, CHEM 8 - Abnormal; Notable for the following:    Sodium 120 (*)    Potassium 6.3 (*)    Chloride 87 (*)    BUN 29 (*)    Creatinine, Ser 1.60 (*)    Glucose, Bld >700 (*)    All other components within normal limits  CG4 I-STAT (LACTIC ACID) - Abnormal; Notable for the following:    Lactic Acid, Venous 2.89 (*)    All other components within normal limits  POCT I-STAT 3, BLOOD GAS (  G3P V) - Abnormal; Notable for the following:    pH, Ven 7.358 (*)    pCO2, Ven 50.6 (*)    Bicarbonate 28.4 (*)    All other components within normal limits  MRSA PCR SCREENING  DIGOXIN LEVEL  TROPONIN I  URINE MICROSCOPIC-ADD ON  URINE RAPID DRUG SCREEN (HOSP PERFORMED)  TROPONIN I  HEMOGLOBIN A1C  TROPONIN I  TROPONIN I  TSH  PROTIME-INR  COMPREHENSIVE METABOLIC PANEL  CBC  POCT I-STAT TROPONIN I   Dg Chest Port 1 View  05/08/2012  *RADIOLOGY REPORT*  Clinical Data: Dizziness and cough.  PORTABLE CHEST - 1 VIEW  Comparison: One-view chest 04/25/2011.  Findings: Heart size is normal.  The lungs are clear.  The visualized soft tissues and bony thorax are unremarkable.  IMPRESSION: Negative chest.   Original Report Authenticated By: Marin Roberts, M.D.      1. Hyperglycemia   2. Atrial fibrillation with rapid ventricular response   3. Atrial fibrillation with RVR   4. Chronic systolic congestive heart failure   5. Hyperosmolar non-ketotic state in patient with type 2 diabetes mellitus   6. Lactic acid acidosis      Date: 05/08/2012  Rate: 136  Rhythm: atrial fibrillation and rvr  QRS Axis: indeterminate  Intervals: afib  ST/T Wave abnormalities: afib and lateral ST changes  Conduction Disutrbances:nonspecific intraventricular conduction delay  Narrative Interpretation:   Old EKG Reviewed: changes noted    Patient has shortness of breath.  Found to be in atrial fibrillation with RVR. He was also found to be a new-onset diabetic. His hyperosmolar nonketotic. He was seen in ER by cardiology. They have recommended mild beta blockade. Patient was given bolus of IV insulin and was started on insulin drip. Heart rate has improved. He will be admitted to step down.  CRITICAL CARE Performed by: Billee Cashing   Total critical care time: 30   Critical care time was exclusive of separately billable procedures and treating other patients.  Critical care was necessary to treat or prevent imminent or life-threatening deterioration.  Critical care was time spent personally by me on the following activities: development of treatment plan with patient and/or surrogate as well as nursing, discussions with consultants, evaluation of patient's response to treatment, examination of patient, obtaining history from patient or surrogate, ordering and performing treatments and interventions, ordering and review of laboratory studies, ordering and review of radiographic studies, pulse oximetry and re-evaluation of patient's condition.      Juliet Rude. Rubin Payor, MD 05/08/12 2018

## 2012-05-08 NOTE — ED Notes (Signed)
CBG >600 °

## 2012-05-08 NOTE — ED Notes (Signed)
Results of chem 8 shown to Dr. Rubin Payor

## 2012-05-08 NOTE — ED Notes (Signed)
Pt coming from Urgent Care, pt experiecing a/fib with RVR. Pt reports he was feeling a little lightheaded/weakness x 2days so he went to the urgent care, they started and IV and administered 2Liters of NS. Bp 99/68 HR 130s 97% on2l/min. 20G left forearm.

## 2012-05-08 NOTE — Progress Notes (Signed)
ANTICOAGULATION CONSULT NOTE - Initial Consult  Pharmacy Consult for warfarin Indication: atrial fibrillation  No Known Allergies  Patient Measurements: Weight: 229 lb 1.6 oz (103.919 kg)  Vital Signs: Temp: 97.8 F (36.6 C) (02/10 1202) Temp src: Axillary (02/10 1202) BP: 127/82 mmHg (02/10 1445) Pulse Rate: 140 (02/10 1501)  Labs:  Recent Labs  05/08/12 1249 05/08/12 1327 05/08/12 1329  HGB 15.2 16.7  --   HCT 42.8 49.0  --   PLT 340  --   --   LABPROT 22.6*  --   --   INR 2.09*  --   --   CREATININE  --  1.60* 1.38*  TROPONINI  --   --  <0.30    The CrCl is unknown because both a height and weight (above a minimum accepted value) are required for this calculation.   Medical History: Past Medical History  Diagnosis Date  . Sinusitis     a.  s/p sinus surgery in past  . Tobacco abuse     a.  45+ yrs 1/2-1ppd.  . Atrial fibrillation     a.  Dx 04/22/2011;  b. 04/26/11 failed ibutilide-facilitated DCCV (TEE w/o clot);  c. S/p DCCV 04/2011 but did not maintain NSR. d. amio stopped 3/13 due to not maintaining NSR.    [name] History of drug abuse     Smoked marijuana 1-2/ week up until about a year ago. Snorted and/or smoke cocaine  approximately 1/month until 5 years ago  . 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 03/2011 echo EF 20-35%, Glob HK; myoview 04/2011 with ?attn versus scar, no significant ischemia, EF 43%.  . Acute  systolic heart failure     a. 03/6107  . Renal insufficiency   . Anticoagulated on Coumadin     a.  initiated 03/2011    Medications:  Digoxin, furosemide, lisinopril, lopressor, KCL, warfarin 5mg  daily  Assessment: 67 year old man on warfarin prior to admission to continue on warfarin while hospitalized.  INR is therapeutic at 2.09. Goal of Therapy:  INR 2-3    Plan:  Continue home dose of 5mg  daily Daily protimes.  Mickeal Skinner 05/08/2012,3:12 PM

## 2012-05-08 NOTE — ED Notes (Signed)
Blood gas venous performed in minilab.

## 2012-05-08 NOTE — ED Provider Notes (Addendum)
Chief Complaint  Patient presents with  . Dizziness    History of Present Illness:   Danial Hlavac is a 67 year old male with a prior history of atrial fibrillation who presents with a one-day history of dizziness that comes and goes. He felt weak all over, had dry mouth, poor appetite, and he's not slept well last night. He has been in and out of atrial fibrillation for the past year. He is followed by Dr. Elease Hashimoto. He's been cardioverted 3 times never been able to maintain sinus rhythm. He has a history of cardiomyopathy, heart failure, left ventricular dysfunction, drug abuse, renal insufficiency, and malignant hyperthermia. He denies any headache, shortness of breath, chest pain, tightness, pressure, syncope, or neurological symptoms. He did complain of blurry vision. He smokes a half pack of cigarettes a day and denies any current alcohol or drug abuse.  Review of Systems:  Other than noted above, the patient denies any of the following symptoms. Systemic:  No fever, chills, sweats, or fatigue. ENT:  No nasal congestion, rhinorrhea, or sore throat. Pulmonary:  No cough, wheezing, shortness of breath, sputum production, hemoptysis. Cardiac:  No palpitations, rapid heartbeat, dizziness, presyncope or syncope. GI:  No abdominal pain, heartburn, nausea, or vomiting. Ext:  No leg pain or swelling.  PMFSH:  Past medical history, family history, social history, meds, and allergies were reviewed and updated as needed.   Physical Exam:   Vital signs:  There were no vitals taken for this visit. Gen:  Alert, oriented, in no distress, skin warm and dry. Eye:  PERRL, lids and conjunctivas normal.  Sclera non-icteric. ENT:  Mucous membranes moist, pharynx clear. Neck:  Supple, no adenopathy or tenderness.  No JVD. Lungs:  Clear to auscultation, no wheezes, rales or rhonchi.  No respiratory distress. Heart:  Rhythm is irregularly irregular and rapid.  No gallops, murmers, clicks or rubs. Chest:  No  chest wall tenderness. Abdomen:  Soft, nontender, no organomegaly or mass.  Bowel sounds normal.  No pulsatile abdominal mass or bruit. Ext:  No edema.  No calf tenderness and Homann's sign negative.  Pulses full and equal. Skin:  Warm and dry.  No rash.  EKG:   Date: 05/08/2012  Rate: 136  Rhythm: atrial fibrillation  QRS Axis: right  Intervals: normal  ST/T Wave abnormalities: Marketed ST depressions in leads V2 through V6, II, III, and aVF.  Conduction Disutrbances:none  Narrative Interpretation: Atrial fibrillation with rapid ventricular response, right superior axis deviation, pulmonary disease pattern, right ventricular hypertrophy, marked ST abnormality, possibly inferior subendocardial injury.  Old EKG Reviewed: none available   Medications given in UCC:  He was begun on IV normal saline elbow transferred to the emergency department via [facility].  Assessment:  The encounter diagnosis was Atrial fibrillation.   Plan:   1.  The following meds were prescribed:   New Prescriptions   No medications on file   2.  The patient was transferred to the emergency department via [facility] in stable condition.  Medical Decision Making:  67 year old male with 1 year history of atrial fib presents with a 1 day history of dizziness.  Denies chest pain, dyspnea, or neuro symptoms.  On exam he has atrial fib with rapid ventricular response, rate 120-140.  We have started IV NS and will transfer via [facility].   Reuben Likes, MD 05/08/12 1127  Reuben Likes, MD 05/08/12 1155

## 2012-05-08 NOTE — ED Notes (Signed)
Sawyer Mentzer (pt's son) cell phone # [phone] or [phone]

## 2012-05-08 NOTE — ED Notes (Signed)
Dizzy and weak since yesterday; can't see well

## 2012-05-09 DIAGNOSIS — I517 Cardiomegaly: Secondary | ICD-10-CM | POA: Diagnosis present

## 2012-05-09 DIAGNOSIS — E860 Dehydration: Secondary | ICD-10-CM

## 2012-05-09 DIAGNOSIS — E1110 Type 2 diabetes mellitus with ketoacidosis without coma: Secondary | ICD-10-CM

## 2012-05-09 DIAGNOSIS — I509 Heart failure, unspecified: Secondary | ICD-10-CM

## 2012-05-09 LAB — POCT I-STAT, CHEM 8
Chloride: 87 mEq/L — ABNORMAL LOW (ref 96–112)
Glucose, Bld: >700 mg/dL (ref 70–99)
HCT: 49 % (ref 39.0–52.0)
Hemoglobin: 16.7 g/dL (ref 13.0–17.0)
Potassium: 6.3 mEq/L (ref 3.5–5.1)
Sodium: 120 mEq/L — ABNORMAL LOW (ref 135–145)

## 2012-05-09 LAB — CBC
HCT: 39.8 % (ref 39.0–52.0)
MCHC: 35.9 g/dL (ref 30.0–36.0)
MCV: 82.1 fL (ref 78.0–100.0)
Platelets: 264 10*3/uL (ref 150–400)
RDW: 13.4 % (ref 11.5–15.5)
WBC: 9.8 10*3/uL (ref 4.0–10.5)

## 2012-05-09 LAB — COMPREHENSIVE METABOLIC PANEL
ALT: 16 U/L (ref 0–53)
Albumin: 3 g/dL — ABNORMAL LOW (ref 3.5–5.2)
Alkaline Phosphatase: 99 U/L (ref 39–117)
BUN: 20 mg/dL (ref 6–23)
Chloride: 96 mEq/L (ref 96–112)
GFR calc Af Amer: 77 mL/min — ABNORMAL LOW (ref >90)
Glucose, Bld: 174 mg/dL — ABNORMAL HIGH (ref 70–99)
Potassium: 3.5 mEq/L (ref 3.5–5.1)
Sodium: 133 mEq/L — ABNORMAL LOW (ref 135–145)
Total Bilirubin: 0.4 mg/dL (ref 0.3–1.2)
Total Protein: 6.1 g/dL (ref 6.0–8.3)

## 2012-05-09 LAB — HEMOGLOBIN A1C
Hgb A1c MFr Bld: 15.2 % — ABNORMAL HIGH (ref <5.7)
Mean Plasma Glucose: 390 mg/dL — ABNORMAL HIGH (ref <117)

## 2012-05-09 LAB — GLUCOSE, CAPILLARY
Glucose-Capillary: 156 mg/dL — ABNORMAL HIGH (ref 70–99)
Glucose-Capillary: 300 mg/dL — ABNORMAL HIGH (ref 70–99)
Glucose-Capillary: 281 mg/dL — ABNORMAL HIGH (ref 70–99)
Glucose-Capillary: 295 mg/dL — ABNORMAL HIGH (ref 70–99)

## 2012-05-09 LAB — HEPARIN LEVEL (UNFRACTIONATED): Heparin Unfractionated: <0.1 IU/mL — ABNORMAL LOW (ref 0.30–0.70)

## 2012-05-09 MED ORDER — HEPARIN BOLUS VIA INFUSION
3000.0000 [IU] | Freq: Once | INTRAVENOUS | Status: AC
  Administered 2012-05-09: 3000 [IU] via INTRAVENOUS
  Filled 2012-05-09: qty 3000

## 2012-05-09 MED ORDER — INSULIN ASPART 100 UNIT/ML ~~LOC~~ SOLN
6.0000 [IU] | Freq: Three times a day (TID) | SUBCUTANEOUS | Status: DC
  Administered 2012-05-09 – 2012-05-10 (×5): 6 [IU] via SUBCUTANEOUS

## 2012-05-09 MED ORDER — SODIUM CHLORIDE 0.9 % IV BOLUS (SEPSIS): 500.0000 mL | Freq: Once | INTRAVENOUS | Status: AC

## 2012-05-09 MED ORDER — DILTIAZEM HCL 100 MG IV SOLR
5.0000 mg/h | INTRAVENOUS | Status: DC
  Administered 2012-05-09: 5 mg/h via INTRAVENOUS
  Filled 2012-05-09: qty 100

## 2012-05-09 MED ORDER — DILTIAZEM LOAD VIA INFUSION
20.0000 mg | Freq: Once | INTRAVENOUS | Status: AC
  Administered 2012-05-09: 20 mg via INTRAVENOUS
  Filled 2012-05-09: qty 20

## 2012-05-09 MED ORDER — SODIUM CHLORIDE 0.9 % IV SOLN
INTRAVENOUS | Status: DC
  Administered 2012-05-09: 125 mL via INTRAVENOUS
  Administered 2012-05-10 – 2012-05-11 (×2): via INTRAVENOUS

## 2012-05-09 MED ORDER — SODIUM CHLORIDE 0.9 % IV BOLUS (SEPSIS)
500.0000 mL | Freq: Once | INTRAVENOUS | Status: AC
  Administered 2012-05-09: 500 mL via INTRAVENOUS

## 2012-05-09 MED ORDER — LIVING WELL WITH DIABETES BOOK
Freq: Once | Status: AC
  Administered 2012-05-09: 11:00:00
  Filled 2012-05-09: qty 1

## 2012-05-09 MED ORDER — INSULIN ASPART 100 UNIT/ML ~~LOC~~ SOLN
0.0000 [IU] | Freq: Three times a day (TID) | SUBCUTANEOUS | Status: DC
  Administered 2012-05-09 (×2): 11 [IU] via SUBCUTANEOUS
  Administered 2012-05-10 – 2012-05-11 (×5): 7 [IU] via SUBCUTANEOUS
  Administered 2012-05-12: 4 [IU] via SUBCUTANEOUS
  Administered 2012-05-12: 7 [IU] via SUBCUTANEOUS
  Administered 2012-05-13: 4 [IU] via SUBCUTANEOUS

## 2012-05-09 MED ORDER — BD GETTING STARTED TAKE HOME KIT: 1/2ML X 30G SYRINGES
1.0000 | Freq: Once | Status: DC
  Filled 2012-05-09: qty 1

## 2012-05-09 MED ORDER — HEPARIN (PORCINE) IN NACL 100-0.45 UNIT/ML-% IJ SOLN
1850.0000 [IU]/h | INTRAMUSCULAR | Status: DC
  Administered 2012-05-09: 1850 [IU]/h via INTRAVENOUS
  Administered 2012-05-09: 1550 [IU]/h via INTRAVENOUS
  Administered 2012-05-09 – 2012-05-10 (×2): 1850 [IU]/h via INTRAVENOUS
  Filled 2012-05-09 (×6): qty 250

## 2012-05-09 MED ORDER — BD GETTING STARTED TAKE HOME KIT: 3/10ML X 30G SYRINGES
1.0000 | Freq: Once | Status: AC
  Administered 2012-05-09: 1
  Filled 2012-05-09: qty 1

## 2012-05-09 MED ORDER — INSULIN ASPART 100 UNIT/ML ~~LOC~~ SOLN
0.0000 [IU] | Freq: Every day | SUBCUTANEOUS | Status: DC
  Administered 2012-05-10: 2 [IU] via SUBCUTANEOUS

## 2012-05-09 NOTE — Progress Notes (Signed)
[facility] Progress Note [place] TEAM 1 - Stepdown/ICU Darryl [name] ZOX:[id number] DOB: [birth year] DOA: 05/08/2012 PCP: [name], MD  Brief narrative: 67 year old male patient with chronic kidney disease and chronic systolic heart failure diagnosed initially in January 2013 which was felt to be tachycardia (nonischemic) related. He also has atrial fibrillation and is on chronic Coumadin anticoagulation. He presented to the urgent care on the date of admission with reports of weakness, dizziness, polydipsia and polyuria. He has felt poorly for least one month with decreased appetite and generalized malaise. He's also been experiencing occasional blurred vision upon standing. He was found to be in atrial fibrillation with RVR. He received 2 L of normal saline in urgent care and transferred to [facility] emergency department.  Evaluation in the ER revealed atrial fibrillation with RVR. Metabolic acidosis and hyponatremia and a blood glucose greater than 800 with an anion gap of 15. He was started on an insulin infusion and admitted to the step down unit. Because of his uncontrolled atrial fibrillation a cardiology consultation was obtained.  Assessment/Plan: Principal Problem:  Mild DKA, type II /   Diabetes mellitus, new onset -AG closed -Transitioned from insulin gtt to Lantus- will use resistant SSI w/HS coverage and meal coverage -Check HgbA1c -diabetes educator consult -pt endorsed does not have PCP/we will need to find one for him  Active Problems:   Atrial fibrillation with RVR -rate remains poorly controlled- likely 2/2 volume depletion -BP soft so HOLD Lopressor for now in favor of IV Cardizem -Continue digoxin   Warfarin anticoagulation -Cards holding Warfarin in favor of Heparin IV in anticipation of cath this admit -Phamacy managing    Dehydration/  Hyponatremia / Leukocytosis -SBP dropped into the 80's w/standing -Have given 1000 cc NS boluses and  increase maintenance fluids to 150 cc/hr -All d/t DKA    CKD (chronic kidney disease) stage 2, GFR 60-89 ml/min -Mild acute renal insuff. Due to dehydration- better since admit/fluids    Chronic systolic CHF (congestive heart failure), NYHA class 3/ EF 20-25 %-up to 43% per recent Myoview -Cards plans cath this admit -currently compensated    Right ventricular dilation (moderate)/ ? COPD/  tobacco abuse x45 years -Likely has underlying COPD -ECHO last month also c/w chronic RHF risk-pulmonic valve was poorly visualized but suspect has pulmonary HTN as well.      DVT prophylaxis: On full dose anticoagulation with morphine at presentation-now transitioned IV heparin Code Status: Full Family Communication: Patient Disposition Plan: Stepdown  Consultants: Cardiology  Procedures: None  Antibiotics: None  HPI/Subjective: Patient without any specific complaints. Did report mild dizziness when not in supine position. Denies chest pain or awareness of tachypalpitations appear   Objective: Blood pressure 114/68, pulse 108, temperature 97.6 F (36.4 C), temperature source Oral, resp. rate 19, height 6\' 2"  (1.88 m), weight 103.5 kg (228 lb 2.8 oz), SpO2 98.00%.  Intake/Output Summary (Last 24 hours) at 05/09/12 1059 Last data filed at 05/09/12 1047  Gross per 24 hour  Intake   1907 ml  Output   1450 ml  Net    457 ml     Exam: General: No acute respiratory distress Lungs: Clear to auscultation bilaterally without wheezes or crackles, room air Cardiovascular: Irregular rate and atrial fibrillation rhythm without murmur gallop or rub normal S1 and S2, no JVD or peripheral edema, IV fluids at 150 cc per hour Abdomen: Nontender, nondistended, soft, bowel sounds positive, no rebound, no ascites, no appreciable mass Musculoskeletal: No  significant cyanosis, clubbing of bilateral lower extremities Neurological: Alert and oriented x3, moves all extremities x4 without any appreciable  focal deficits, cranial nerves 2-12 grossly intact  Data Reviewed: Basic Metabolic Panel:  Recent Labs Lab 05/08/12 1327 05/08/12 1329 05/09/12 0550  NA 120* 119* 133*  K 6.3* 5.1 3.5  CL 87* 80* 96  CO2  --  24 26  GLUCOSE >700* 838* 174*  BUN 29* 26* 20  CREATININE 1.60* 1.38* 1.14  CALCIUM  --  9.7 8.9   Liver Function Tests:  Recent Labs Lab 05/09/12 0550  AST 17  ALT 16  ALKPHOS 99  BILITOT 0.4  PROT 6.1  ALBUMIN 3.0*   No results found for this basename: LIPASE, AMYLASE,  in the last 168 hours No results found for this basename: AMMONIA,  in the last 168 hours CBC:  Recent Labs Lab 05/08/12 1249 05/08/12 1327 05/09/12 0550  WBC 11.5*  --  9.8  NEUTROABS 9.0*  --   --   HGB 15.2 16.7 14.3  HCT 42.8 49.0 39.8  MCV 82.6  --  82.1  PLT 340  --  264   Cardiac Enzymes:  Recent Labs Lab 05/08/12 1329 05/08/12 1631 05/08/12 2237 05/09/12 0550  TROPONINI <0.30 <0.30 <0.30 <0.30   BNP (last 3 results) No results found for this basename: PROBNP,  in the last 8760 hours CBG:  Recent Labs Lab 05/09/12 0142 05/09/12 0240 05/09/12 0347 05/09/12 0450 05/09/12 0813  GLUCAP 125* 135* 167* 156* 300*    Recent Results (from the past 240 hour(s))  MRSA PCR SCREENING     Status: None   Collection Time    05/08/12  4:49 PM      Result Value Range Status   MRSA by PCR NEGATIVE  NEGATIVE Final   Comment:            The GeneXpert MRSA Assay (FDA     approved for NASAL specimens     only), is one component of a     comprehensive MRSA colonization     surveillance program. It is not     intended to diagnose MRSA     infection nor to guide or     monitor treatment for     MRSA infections.     Studies:  Recent x-ray studies have been reviewed in detail by the Attending Physician  Scheduled Meds:  Reviewed in detail by the Attending Physician   Junious Silk, ANP [facility] Office  [phone] Pager [phone]  On-Call/Text  Page:      [email or web address]      password [email or web address]  If 7PM-7AM, please contact night-coverage [email or web address] Password [facility] 05/09/2012, 10:59 AM   LOS: 1 day   I have examined the patient, reviewed the chart and modified the above note which I agree with.   Darryl Whyte,MD [phone] 05/09/2012, 2:55 PM

## 2012-05-09 NOTE — Progress Notes (Signed)
Utilization Review Completed. 05/09/2012  

## 2012-05-09 NOTE — Progress Notes (Signed)
ANTICOAGULATION CONSULT NOTE Pharmacy Consult for heparin Indication: atrial fibrillation  No Known Allergies  Patient Measurements: Height: 6\' 2"  (188 cm) Weight: 228 lb 2.8 oz (103.5 kg) IBW/kg (Calculated) : 82.2  Vital Signs: Temp: 98.1 F (36.7 C) (02/11 1550) Temp src: Oral (02/11 1550) BP: 108/63 mmHg (02/11 1604) Pulse Rate: 118 (02/11 1604)  Labs:  Recent Labs  05/08/12 1249 05/08/12 1327  05/08/12 1329 05/08/12 1631 05/08/12 2237 05/09/12 0550 05/09/12 1550  HGB 15.2 16.7  --   --   --   --  14.3  --   HCT 42.8 49.0  --   --   --   --  39.8  --   PLT 340  --   --   --   --   --  264  --   LABPROT 22.6*  --   --   --   --   --  21.2*  --   INR 2.09*  --   --   --   --   --  1.92*  --   HEPARINUNFRC  --   --   --   --   --   --   --  <0.10*  CREATININE  --  1.60*  --  1.38*  --   --  1.14  --   TROPONINI  --   --   < > <0.30 <0.30 <0.30 <0.30  --   < > = values in this interval not displayed.  Estimated Creatinine Clearance: 84 ml/min (by C-G formula based on Cr of 1.14).  Assessment: 67 year old man with h/o AFib, INR subtherapeutic, receving heparin to bridge while coumadin on hold for possible cath. Initial heparin level undetectable. Will adjust rate.  Goal of Therapy:  Heparin level 0.3-0.7   Plan:  Heparin 3000 unit IV bolus then increase gtt to 1850 units/hr. Check 8 hour heparin level.  Verlene Mayer, PharmD, BCPS Pager [phone] 05/09/2012,4:57 PM

## 2012-05-09 NOTE — Progress Notes (Signed)
During the course of the day nurse educated pt on checking blood sugar, drawing up insulin, and administering insulin. Nurse gave insulin starter kit to pt and reviewed with return demonstration however education will continue to be reinforced.  Nurse will continue to educate pt per order.  Pt viewed diabetes video and received "Living with Diabetes" pamphlet.

## 2012-05-09 NOTE — Progress Notes (Signed)
ANTICOAGULATION CONSULT NOTE Pharmacy Consult for heparin Indication: atrial fibrillation  No Known Allergies  Patient Measurements: Height: 6\' 2"  (188 cm) Weight: 228 lb 2.8 oz (103.5 kg) IBW/kg (Calculated) : 82.2  Vital Signs: Temp: 97.7 F (36.5 C) (02/11 0350) Temp src: Oral (02/11 0350) BP: 112/75 mmHg (02/11 0655) Pulse Rate: 86 (02/11 0500)  Labs:  Recent Labs  05/08/12 1249 05/08/12 1327  05/08/12 1329 05/08/12 1631 05/08/12 2237 05/09/12 0550  HGB 15.2 16.7  --   --   --   --  14.3  HCT 42.8 49.0  --   --   --   --  39.8  PLT 340  --   --   --   --   --  264  LABPROT 22.6*  --   --   --   --   --  21.2*  INR 2.09*  --   --   --   --   --  1.92*  CREATININE  --  1.60*  --  1.38*  --   --  1.14  TROPONINI  --   --   < > <0.30 <0.30 <0.30 <0.30  < > = values in this interval not displayed.  Estimated Creatinine Clearance: 84 ml/min (by C-G formula based on Cr of 1.14).  Assessment: 67 year old man with h/o AFib, INR subtherapeutic, for heparin  Goal of Therapy:  Heparin level 0.3-0.7   Plan:  Start heparin 1550 units/hr Check heparin level in 8 hours.  Darryl [name] 05/09/2012,7:45 AM

## 2012-05-09 NOTE — Progress Notes (Signed)
Inpatient Diabetes Program Recommendations  AACE/ADA: New Consensus Statement on Inpatient Glycemic Control (2013)  Target Ranges:  Prepandial:   less than 140 mg/dL      Peak postprandial:   less than 180 mg/dL (1-2 hours)      Critically ill patients:  140 - 180 mg/dL   Reason for Visit: Results for Darryl, [name] (MRN [id number]) as of 05/09/2012 14:37  Ref. Range 05/09/2012 03:47 05/09/2012 04:50 05/09/2012 08:13 05/09/2012 12:10  Glucose-Capillary Latest Range: 70-99 mg/dL 409 (H) 811 (H) 914 (H) 281 (H)   Note patient with new onset diabetes.  Patient has symptoms of blurred vision, thirst, and frequent urination.  His A1C=14.5% indicating average CBG's around 365 mg/dL.  He currently does not have a PCP and states "I need to get one".   He lives alone however son was visiting when I initially came into patients room.  Discussed hypoglycemia, hyperglycemia, and insulin administration with patient.  Showed him how to watch diabetes videos via patient education network.  He seems slightly overwhelmed.  RN to begin teaching insulin administration with supper.  Recommend that patient have PCP to follow-up with prior to discharge.  Will order outpatient diabetes education per protocol.  Patient has "Living Well with Diabetes Booklet" at bedside. Will follow.

## 2012-05-09 NOTE — Progress Notes (Signed)
PROGRESS NOTE  Subjective:   Pt is a 67 yo with hx of chronic AF and CHF  was admitted with markedly elevated glucose levels.  Also noted to have elevated ventricular response.    Objective:    Vital Signs:   Temp:  [97 F (36.1 C)-98.9 F (37.2 C)] 97.7 F (36.5 C) (02/11 0350) Pulse Rate:  [28-140] 86 (02/11 0500) Resp:  [15-25] 25 (02/11 0655) BP: (81-127)/(44-93) 112/75 mmHg (02/11 0655) SpO2:  [95 %-100 %] 99 % (02/11 0500) Weight:  [228 lb 2.8 oz (103.5 kg)-229 lb 1.6 oz (103.919 kg)] 228 lb 2.8 oz (103.5 kg) (02/10 1631)  Last BM Date: 05/07/12   24-hour weight change: Weight change:   Weight trends: Filed Weights   05/08/12 1350 05/08/12 1631  Weight: 229 lb 1.6 oz (103.919 kg) 228 lb 2.8 oz (103.5 kg)    Intake/Output:  02/10 0701 - 02/11 0700 In: 1651 [P.O.:898; I.V.:753] Out: 1450 [Urine:1450]     Physical Exam: BP 112/75  Pulse 86  Temp(Src) 97.7 F (36.5 C) (Oral)  Resp 25  Ht 6\' 2"  (1.88 m)  Wt 228 lb 2.8 oz (103.5 kg)  BMI 29.28 kg/m2  SpO2 99%  General: Vital signs reviewed and noted.  Head: Normocephalic, atraumatic.  Eyes: conjunctivae/corneas clear.  EOM's intact.   Throat: normal  Neck:  normal  Lungs:    clear anteriorly  Heart:  irregularly, irregular, tachy  Abdomen:  Soft, non-tender, non-distended    Extremities: No edema   Neurologic: A&O X3, CN II - XII are grossly intact.   Psych: Normal     Labs: BMET:  Recent Labs  05/08/12 1329 05/09/12 0550  NA 119* 133*  K 5.1 3.5  CL 80* 96  CO2 24 26  GLUCOSE 838* 174*  BUN 26* 20  CREATININE 1.38* 1.14  CALCIUM 9.7 8.9    Liver function tests:  Recent Labs  05/09/12 0550  AST 17  ALT 16  ALKPHOS 99  BILITOT 0.4  PROT 6.1  ALBUMIN 3.0*   No results found for this basename: LIPASE, AMYLASE,  in the last 72 hours  CBC:  Recent Labs  05/08/12 1249 05/08/12 1327 05/09/12 0550  WBC 11.5*  --  9.8  NEUTROABS 9.0*  --   --   HGB 15.2 16.7 14.3  HCT  42.8 49.0 39.8  MCV 82.6  --  82.1  PLT 340  --  264    Cardiac Enzymes:  Recent Labs  05/08/12 1329 05/08/12 1631 05/08/12 2237 05/09/12 0550  TROPONINI <0.30 <0.30 <0.30 <0.30    Coagulation Studies:  Recent Labs  05/08/12 1249 05/09/12 0550  LABPROT 22.6* 21.2*  INR 2.09* 1.92*    Other: No components found with this basename: POCBNP,  No results found for this basename: DDIMER,  in the last 72 hours  Recent Labs  05/08/12 1630  HGBA1C 14.5*   No results found for this basename: CHOL, HDL, LDLCALC, TRIG, CHOLHDL,  in the last 72 hours  Recent Labs  05/08/12 1630  TSH 1.026   No results found for this basename: VITAMINB12, FOLATE, FERRITIN, TIBC, IRON, RETICCTPCT,  in the last 72 hours   Other results:  Tele:  Atrial fib with RVR.  Medications:    Infusions:    Scheduled Medications: . aspirin  81 mg Oral Daily  . digoxin  0.125 mg Oral Daily  . insulin aspart  0-15 Units Subcutaneous TID WC  . insulin aspart  0-5 Units Subcutaneous  QHS  . insulin aspart  3 Units Subcutaneous TID WC  . insulin glargine  20 Units Subcutaneous QHS  . metoprolol  150 mg Oral BID  . sodium chloride  3 mL Intravenous Q12H  . warfarin  5 mg Oral q1800  . Warfarin - Pharmacist Dosing Inpatient   Does not apply q1800    Assessment/ Plan:    1. Atrial fib:  Chronic 2. Chronic systolic CHF:  Likely due to rapid HR.  Will likely need a cath at some point.  Will hold coumadin and start heparin.   Anticipate cath in 1-2 days depending on renal function.  Troponin levels are negative 3. Acute renal failure:  Due to volume depletion due to hyperglycemia.  Improving.    Disposition:  Length of Stay: 1  Vesta Mixer, Montez Hageman., MD, [facility] 05/09/2012, 7:37 AM Office [phone] Pager [phone]

## 2012-05-09 NOTE — Evaluation (Signed)
Physical Therapy Evaluation Patient Details Name: Darryl [name] MRN: [id number] DOB: 02-22-1946 Today's Date: 05/09/2012 Time: 2952-8413 PT Time Calculation (min): 16 min  PT Assessment / Plan / Recommendation Clinical Impression  Pt admitted with hyperglycemia, hyponatremia, Afib, dizziness and limited with mobility due to orhostatic hypotension and Afib. Anticipate pt will be able to return home without further therapy needs but at this time will need acute therapy to monitor cardiopulmonary status with activity tolerance for return to PLOF. RN present and aware of orthostasis. Will follow to maximize mobility, independence and return to PLOF.     PT Assessment  Patient needs continued PT services    Follow Up Recommendations  No PT follow up    Does the patient have the potential to tolerate intense rehabilitation      Barriers to Discharge None      Equipment Recommendations  None recommended by PT    Recommendations for Other Services     Frequency Min 3X/week    Precautions / Restrictions Precautions Precautions: Other (comment) Precaution Comments: watch BP and HR   Pertinent Vitals/Pain Orthostatic BPs  Supine 96/68 (75)  Sitting 83/53 (61)     Standing 73/52 (56)  Standing after 5 min 66/44 (48)   HR 130-156 with sitting and standing sats maintained 99% on RA Pt asymptomatic throughout and returned to supine due to orthostasis      Mobility  Bed Mobility Bed Mobility: Supine to Sit;Sit to Supine Supine to Sit: 6: Modified independent (Device/Increase time);HOB flat Sit to Supine: 6: Modified independent (Device/Increase time);HOB flat Transfers Transfers: Sit to Stand;Stand to Sit Sit to Stand: 6: Modified independent (Device/Increase time);From bed Stand to Sit: 6: Modified independent (Device/Increase time);To bed Details for Transfer Assistance: Pt stood 5 min with supervision for vital signs only not for balance or stability. Pt returned to bed  due to continued declining BP. RN present throughout Ambulation/Gait Ambulation/Gait Assistance: Not tested (comment) (unable due to hypotension)    Exercises     PT Diagnosis: Difficulty walking  PT Problem List: Decreased activity tolerance;Cardiopulmonary status limiting activity PT Treatment Interventions: Gait training;Therapeutic activities;Patient/family education   PT Goals Acute Rehab PT Goals PT Goal Formulation: With patient Time For Goal Achievement: 05/16/12 Potential to Achieve Goals: Good Pt will Ambulate: >150 feet;Independently (with HR below 130 and MAP >60) PT Goal: Ambulate - Progress: Goal set today Pt will Go Up / Down Stairs: 1-2 stairs;Independently PT Goal: Up/Down Stairs - Progress: Goal set today  Visit Information  Last PT Received On: 05/09/12 Assistance Needed: +1    Subjective Data  Subjective: I don't really feel anything even when I was in the hospital last year Patient Stated Goal: return to work   Prior Functioning  Home Living Lives With: Alone Type of Home: House Home Access: Stairs to enter Secretary/administrator of Steps: 1 Entrance Stairs-Rails: None Home Layout: One level Bathroom Shower/Tub: Network engineer: None Prior Function Level of Independence: Independent Able to Take Stairs?: Yes Driving: Yes Vocation: Full time employment Comments: pt works at a [employer] quality [employer], pt tends to eat out a lot Communication Communication: No difficulties    Cognition  Cognition Overall Cognitive Status: Appears within functional limits for tasks assessed/performed Arousal/Alertness: Awake/alert Orientation Level: Appears intact for tasks assessed Behavior During Session: [facility] for tasks performed    Extremity/Trunk Assessment Right Upper Extremity Assessment RUE ROM/Strength/Tone: Within functional levels Left Upper Extremity Assessment LUE ROM/Strength/Tone: Within  functional  levels Right Lower Extremity Assessment RLE ROM/Strength/Tone: Within functional levels Left Lower Extremity Assessment LLE ROM/Strength/Tone: Within functional levels Trunk Assessment Trunk Assessment: Normal   Balance Static Sitting Balance Static Sitting - Balance Support: Feet supported;No upper extremity supported Static Sitting - Level of Assistance: 7: Independent Static Sitting - Comment/# of Minutes: 4 Static Standing Balance Static Standing - Balance Support: No upper extremity supported Static Standing - Level of Assistance: 7: Independent Static Standing - Comment/# of Minutes: 5  End of Session PT - End of Session Equipment Utilized During Treatment: Gait belt Activity Tolerance: Patient tolerated treatment well Patient left: in bed;with call bell/phone within reach;with nursing in room Nurse Communication: Mobility status  GP     Delorse Lek 05/09/2012, 9:06 AM Delaney Meigs, PT [phone]

## 2012-05-09 NOTE — Progress Notes (Signed)
INITIAL NUTRITION ASSESSMENT  DOCUMENTATION CODES Per approved criteria  -Severe malnutrition in the context of acute illness or injury   INTERVENTION: 1. RD provided educational materials and reviewed carbohydrate counting with patient. 2. Pt is currently eating well and likely meeting estimated needs at this time, if intake declines, recommend additional of Glucerna Shake daily. 3. RD to continue to follow nutrition care plan.  NUTRITION DIAGNOSIS: 1. Unintentional wt loss related to uncontrolled DM as evidenced by 11% wt loss x 3 months. 2. Food- and nutrition-related knowledge deficit r/t limited prior diet education AEB new onset diabetes.  Goal: 1. Pt to meet >/= 90% of their estimated nutrition needs. 2. Verbalize basic understanding of carbohydrate counting.  Monitor:  weight trends, lab trends, I/O's, PO intake, supplement tolerance, further education needs  Reason for Assessment: Malnutrition Screening + MD Consult  67 y.o. male  Admitting Dx: Hyperosmolar non-ketotic state in patient with type 2 diabetes mellitus  ASSESSMENT: Admitted with polyuria and polydipsia, work-up reveals new onset DM. Most recent HgbA1c is 14.5. RD consulted for diet education for new onset DM.  Pt is currently ordered for Carbohydrate Modified Medium diet. Consumed 100% of his dinner last night.  Pt also notes poor appetite and weight loss x 1 month. Per EPIC weight history, pt with 11% x 3 months - this is significant. Pt reports that for the past month, intake has been poor, noting that he has had insatiable thirst and has been unable to eat foods, drinking lots of regular sodas and juices. Pt is now eating very well and confirms that his appetite is great and his thirst is improving.  Pt meets criteria for severe MALNUTRITION in the context of acute illness as evidenced by 11% wt loss x 3 months and intake of <50% x at least 1 week.  Noted sodium level currently low, but trending up. Most  recent sodium is 133. MD suspects sodium level to normalize with better blood sugar control.  RD provided "Carbohydrate Counting for People with Diabetes" handout from the Academy of Nutrition and Dietetics. Discussed different food groups and their effects on blood sugar, emphasizing carbohydrate-containing foods. Provided list of carbohydrates and recommended serving sizes of common foods.  Discussed importance of controlled and consistent carbohydrate intake throughout the day. Provided examples of ways to balance meals/snacks and encouraged intake of high-fiber, whole grain complex carbohydrates. Teach back method used.  Expect fair compliance.  Height: Ht Readings from Last 1 Encounters:  05/08/12 6\' 2"  (1.88 m)    Weight: Wt Readings from Last 1 Encounters:  05/08/12 228 lb 2.8 oz (103.5 kg)    Ideal Body Weight: 190 lb/86.4 kg  % Ideal Body Weight: 120%  Wt Readings from Last 10 Encounters:  05/08/12 228 lb 2.8 oz (103.5 kg)  02/04/12 257 lb 12.8 oz (116.937 kg)  11/09/11 258 lb (117.028 kg)  06/29/11 248 lb (112.492 kg)  06/04/11 245 lb (111.131 kg)  05/24/11 245 lb (111.131 kg)  05/24/11 245 lb (111.131 kg)  05/19/11 244 lb 3.2 oz (110.768 kg)  05/13/11 244 lb (110.678 kg)  05/05/11 245 lb (111.131 kg)    Usual Body Weight: 248 - 258 lb  % Usual Body Weight: 90%  BMI:  Body mass index is 29.28 kg/(m^2). Overweight.  Estimated Nutritional Needs: Kcal: 1900 - 2100 Protein: 70 - 80 grams Fluid: 1.9 - 2.1 liters daily  Skin: hand abrasion  Diet Order: Carb Control Medium (1600 - 2000)  EDUCATION NEEDS: -No education needs identified  at this time   Intake/Output Summary (Last 24 hours) at 05/09/12 0924 Last data filed at 05/09/12 0600  Gross per 24 hour  Intake   1651 ml  Output   1450 ml  Net    201 ml    Last BM: 2/9  Labs:   Recent Labs Lab 05/08/12 1327 05/08/12 1329 05/09/12 0550  NA 120* 119* 133*  K 6.3* 5.1 3.5  CL 87* 80* 96  CO2   --  24 26  BUN 29* 26* 20  CREATININE 1.60* 1.38* 1.14  CALCIUM  --  9.7 8.9  GLUCOSE >700* 838* 174*    CBG (last 3)   Recent Labs  05/09/12 0347 05/09/12 0450 05/09/12 0813  GLUCAP 167* 156* 300*    Scheduled Meds: . aspirin  81 mg Oral Daily  . bd getting started take home kit  1 kit Other Once  . digoxin  0.125 mg Oral Daily  . insulin aspart  0-20 Units Subcutaneous TID WC  . insulin aspart  0-5 Units Subcutaneous QHS  . insulin aspart  6 Units Subcutaneous TID WC  . insulin glargine  20 Units Subcutaneous QHS  . living well with diabetes book   Does not apply Once  . metoprolol  150 mg Oral BID  . sodium chloride  500 mL Intravenous Once  . sodium chloride  3 mL Intravenous Q12H    Continuous Infusions: . sodium chloride    . heparin 1,550 Units/hr (05/09/12 4782)    Past Medical History  Diagnosis Date  . Sinusitis     a.  s/p sinus surgery in past  . Tobacco abuse     a.  45+ yrs 1/2-1ppd.  . Atrial fibrillation     a.  Dx 04/22/2011;  b. 04/26/11 failed ibutilide-facilitated DCCV (TEE w/o clot);  c. S/p DCCV 04/2011 but did not maintain NSR. d. amio stopped 3/13 due to not maintaining NSR.    [name] History of drug abuse     Smoked marijuana 1-2/ week up until about a year ago. Snorted and/or smoke cocaine  approximately 1/month until 5 years ago  . 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 03/2011 echo EF 20-35%, Glob HK; myoview 04/2011 with ?attn versus scar, no significant ischemia, EF 43%.  . Acute  systolic heart failure     a. 11/5619  . Renal insufficiency   . Anticoagulated on Coumadin     a.  initiated 03/2011    Past Surgical History  Procedure Laterality Date  . Nasal septum surgery      30 YRS AGO  . Tee without cardioversion  04/26/2011    Procedure: TRANSESOPHAGEAL ECHOCARDIOGRAM (TEE);  Surgeon: [name], MD;  Location: [facility] ENDOSCOPY;  Service: Cardiovascular;  Laterality: N/A;  . Cardioversion  04/26/2011    Procedure: CARDIOVERSION;   Surgeon: [name], MD;  Location[facility] ENDOSCOPY;  Service: Cardiovascular;  Laterality: N/A;  . Cardioversion  05/24/2011    Procedure: CARDIOVERSION;  Surgeon: [name], MD;  Location: [facility] OR;  Service: Cardiovascular;  Laterality: N/A;   Jarold Motto MS, RD, LDN Pager: [phone] After-hours pager: [phone]

## 2012-05-09 NOTE — Progress Notes (Signed)
Nurse contacted MD to inform that pt HR is sustained 110's-120's with periodic increases into 130's on 5mg  of Cardizem drip, BP remains soft with systolic in 90's and therefore nurse raised concern with increasing Cardizem drip.  MD instructed nurse to keep Cardizem drip at 5mg  at this current time due to soft BP and stated that fluid status intervention may reduce HR as well, provided new parameter goal to keep HR no higher than 120's given current BP trend.  Nurse will continue to monitor.

## 2012-05-09 NOTE — Progress Notes (Addendum)
1700:  MD responded to nurse page.  Requested current HR, MD acknowledged Cardizem drip was stopped due to outside of parameters BP.  MD instructed nurse to D/C cardizem drip in Epic.  Nurse will continue to monitor.  1500: MD paged to inform of systolic BP ranging in 80's.  Nurse stopped Cardizem drip, pt asymptomatic, communicated via page.  HR remains elevated. Vitals documented in Manufacturing engineer.  Nurse asked for advisement/intervention.  Nurse will continue to monitor

## 2012-05-09 NOTE — Progress Notes (Signed)
2135Donnamarie Poag, NP on floor. Informed NP of pt's HR sustaining 140bpm Afib and BP 96/44. Noted order for 150mg  Lopressor. Also updated NP about D51/2NS order. New orders received, updated pt.   0200: Noted Pt's BP's 80/40's, pt lying to side. When pt placed on back and BP rechecked above 90/50.  0455: NP on floor. Updated on pt HR sustainging 125bpm but bouncing between 140-150bpms for ~104mintues and back down to 120bpm. Inquire about blood sugar. Blood sugar has been below 180 consecutively. New orders received, order to continue monitoring HR.  Insulin gtt d/c per order

## 2012-05-10 DIAGNOSIS — R7309 Other abnormal glucose: Secondary | ICD-10-CM

## 2012-05-10 LAB — BASIC METABOLIC PANEL
CO2: 22 mEq/L (ref 19–32)
Glucose, Bld: 260 mg/dL — ABNORMAL HIGH (ref 70–99)
Potassium: 3.8 mEq/L (ref 3.5–5.1)
Sodium: 131 mEq/L — ABNORMAL LOW (ref 135–145)

## 2012-05-10 LAB — CBC
HCT: 35.9 % — ABNORMAL LOW (ref 39.0–52.0)
Hemoglobin: 12.8 g/dL — ABNORMAL LOW (ref 13.0–17.0)
RBC: 4.27 MIL/uL (ref 4.22–5.81)

## 2012-05-10 LAB — GLUCOSE, CAPILLARY
Glucose-Capillary: 249 mg/dL — ABNORMAL HIGH (ref 70–99)
Glucose-Capillary: 206 mg/dL — ABNORMAL HIGH (ref 70–99)

## 2012-05-10 LAB — PROTIME-INR: INR: 2.1 — ABNORMAL HIGH (ref 0.00–1.49)

## 2012-05-10 LAB — HEPARIN LEVEL (UNFRACTIONATED)
Heparin Unfractionated: 0.53 IU/mL (ref 0.30–0.70)
Heparin Unfractionated: 0.71 IU/mL — ABNORMAL HIGH (ref 0.30–0.70)

## 2012-05-10 MED ORDER — HEPARIN (PORCINE) IN NACL 100-0.45 UNIT/ML-% IJ SOLN
1750.0000 [IU]/h | INTRAMUSCULAR | Status: DC
  Administered 2012-05-11: 1750 [IU]/h via INTRAVENOUS
  Filled 2012-05-10 (×3): qty 250

## 2012-05-10 MED ORDER — INSULIN GLARGINE 100 UNIT/ML ~~LOC~~ SOLN
30.0000 [IU] | Freq: Every day | SUBCUTANEOUS | Status: DC
Start: 2012-05-10 — End: 2012-05-10

## 2012-05-10 MED ORDER — DILTIAZEM HCL 30 MG PO TABS
30.0000 mg | ORAL_TABLET | Freq: Four times a day (QID) | ORAL | Status: DC
  Administered 2012-05-10 – 2012-05-12 (×6): 30 mg via ORAL
  Filled 2012-05-10 (×13): qty 1

## 2012-05-10 MED ORDER — INSULIN GLARGINE 100 UNIT/ML ~~LOC~~ SOLN
45.0000 [IU] | Freq: Every day | SUBCUTANEOUS | Status: DC
  Administered 2012-05-10: 45 [IU] via SUBCUTANEOUS

## 2012-05-10 MED ORDER — INSULIN ASPART 100 UNIT/ML ~~LOC~~ SOLN
8.0000 [IU] | Freq: Three times a day (TID) | SUBCUTANEOUS | Status: DC
  Administered 2012-05-11 – 2012-05-13 (×7): 8 [IU] via SUBCUTANEOUS

## 2012-05-10 NOTE — Progress Notes (Signed)
ANTICOAGULATION CONSULT NOTE Pharmacy Consult for heparin Indication: atrial fibrillation  No Known Allergies  Patient Measurements: Height: 6\' 2"  (188 cm) Weight: 240 lb 8.4 oz (109.1 kg) IBW/kg (Calculated) : 82.2  Vital Signs: Temp: 97.5 F (36.4 C) (02/12 1213) Temp src: Oral (02/12 1213) BP: 123/93 mmHg (02/12 1213) Pulse Rate: 105 (02/12 1213)  Labs:  Recent Labs  05/08/12 1249  05/08/12 1327  05/08/12 1329 05/08/12 1631 05/08/12 2237 05/09/12 0550 05/09/12 1550 05/10/12 0420 05/10/12 1239  HGB 15.2  --  16.7  --   --   --   --  14.3  --  12.8*  --   HCT 42.8  --  49.0  --   --   --   --  39.8  --  35.9*  --   PLT 340  --   --   --   --   --   --  264  --  228  --   LABPROT 22.6*  --   --   --   --   --   --  21.2*  --  22.7*  --   INR 2.09*  --   --   --   --   --   --  1.92*  --  2.10*  --   HEPARINUNFRC  --   --   --   --   --   --   --   --  <0.10* 0.53 0.71*  CREATININE  --   < > 1.60*  --  1.38*  --   --  1.14  --  1.00  --   TROPONINI  --   --   --   < > <0.30 <0.30 <0.30 <0.30  --   --   --   < > = values in this interval not displayed.  Estimated Creatinine Clearance: 98.2 ml/min (by C-G formula based on Cr of 1).  Assessment: 67 year old man with h/o AFib, INR subtherapeutic, receving heparin to bridge while coumadin on hold for possible cath. Heparin level is slightly supratherapeutic.  Goal of Therapy:  Heparin level 0.3-0.7   Plan:   Decrease heparin to 1750 units/hr

## 2012-05-10 NOTE — Progress Notes (Signed)
    SUBJECTIVE: No chest pain or SOB.   BP 106/79  Pulse 83  Temp(Src) 98.2 F (36.8 C) (Oral)  Resp 16  Ht 6\' 2"  (1.88 m)  Wt 240 lb 8.4 oz (109.1 kg)  BMI 30.87 kg/m2  SpO2 98%  Intake/Output Summary (Last 24 hours) at 05/10/12 0716 Last data filed at 05/10/12 0700  Gross per 24 hour  Intake   3401 ml  Output   1575 ml  Net   1826 ml    PHYSICAL EXAM General: Well developed, well nourished, in no acute distress. Alert and oriented x 3.  Psych:  Good affect, responds appropriately Neck: No JVD. No masses noted.  Lungs: Clear bilaterally with no wheezes or rhonci noted.  Heart: Irregular, irregular with mild systolic murmur.  Abdomen: Bowel sounds are present. Soft, non-tender.  Extremities: No lower extremity edema.   LABS: Basic Metabolic Panel:  Recent Labs  16/10/96 0550 05/10/12 0420  NA 133* 131*  K 3.5 3.8  CL 96 101  CO2 26 22  GLUCOSE 174* 260*  BUN 20 15  CREATININE 1.14 1.00  CALCIUM 8.9 8.0*   CBC:  Recent Labs  05/08/12 1249  05/09/12 0550 05/10/12 0420  WBC 11.5*  --  9.8 7.7  NEUTROABS 9.0*  --   --   --   HGB 15.2  < > 14.3 12.8*  HCT 42.8  < > 39.8 35.9*  MCV 82.6  --  82.1 84.1  PLT 340  --  264 228  < > = values in this interval not displayed. Cardiac Enzymes:  Recent Labs  05/08/12 1631 05/08/12 2237 05/09/12 0550  TROPONINI <0.30 <0.30 <0.30   Current Meds: . aspirin  81 mg Oral Daily  . digoxin  0.125 mg Oral Daily  . insulin aspart  0-20 Units Subcutaneous TID WC  . insulin aspart  0-5 Units Subcutaneous QHS  . insulin aspart  6 Units Subcutaneous TID WC  . insulin glargine  20 Units Subcutaneous QHS  . sodium chloride  3 mL Intravenous Q12H     ASSESSMENT AND PLAN:  1. Atrial fibrillation: Chronic. Coumadin is being held in anticipation of cardiac cath. Rate mostly controlled on digoxin. Cardizem drip stopped yesterday secondary to soft blood pressures.  HR is 85-120 this am. Will start Cardizem 30 mg po Q6  hours and see if he can tolerate this for better rate control.   2. Chronic systolic CHF: LVEF 20-25% by echo last month. Acute decompensation likely due to rapid HR which is now controlled. Volume status is ok.  INR is 2.1 this am. Per plans of Dr. Elease Hashimoto, will plan cardiac cath when INR is less than 1.7. Heparin drip has been started.     Nimra Puccinelli  2/12/20147:16 AM

## 2012-05-10 NOTE — Progress Notes (Deleted)
ANTICOAGULATION CONSULT NOTE Pharmacy Consult for heparin Indication: atrial fibrillation  No Known Allergies  Patient Measurements: Height: 6\' 2"  (188 cm) Weight: 240 lb 8.4 oz (109.1 kg) IBW/kg (Calculated) : 82.2  Vital Signs: Temp: 97.5 F (36.4 C) (02/12 1213) Temp src: Oral (02/12 1213) BP: 123/93 mmHg (02/12 1213) Pulse Rate: 105 (02/12 1213)  Labs:  Recent Labs  05/08/12 1249  05/08/12 1327  05/08/12 1329 05/08/12 1631 05/08/12 2237 05/09/12 0550 05/09/12 1550 05/10/12 0420 05/10/12 1239  HGB 15.2  --  16.7  --   --   --   --  14.3  --  12.8*  --   HCT 42.8  --  49.0  --   --   --   --  39.8  --  35.9*  --   PLT 340  --   --   --   --   --   --  264  --  228  --   LABPROT 22.6*  --   --   --   --   --   --  21.2*  --  22.7*  --   INR 2.09*  --   --   --   --   --   --  1.92*  --  2.10*  --   HEPARINUNFRC  --   --   --   --   --   --   --   --  <0.10* 0.53 0.71*  CREATININE  --   < > 1.60*  --  1.38*  --   --  1.14  --  1.00  --   TROPONINI  --   --   --   < > <0.30 <0.30 <0.30 <0.30  --   --   --   < > = values in this interval not displayed.  Estimated Creatinine Clearance: 98.2 ml/min (by C-G formula based on Cr of 1).  Assessment: 67 year old man with h/o AFib, INR subtherapeutic, receving heparin to bridge while coumadin on hold for possible cath. Heparin level is just above goal (HL= 0.71) and level prior was at goal. Patient noted for cath when INR < 1.7.  Goal of Therapy:  Heparin level 0.3-0.7   Plan:  -Decrease heparin to 1750 units/hr -Heparin level and CBC daily  Harland German, Pharm D 05/10/2012 1:42 PM

## 2012-05-10 NOTE — Plan of Care (Signed)
Problem: Phase I Progression Outcomes Goal: NPO or per MD order Outcome: Completed/Met Date Met:  05/10/12 Pt tolerating carb modified diet

## 2012-05-10 NOTE — Progress Notes (Signed)
ANTICOAGULATION CONSULT NOTE Pharmacy Consult for heparin Indication: atrial fibrillation  No Known Allergies  Patient Measurements: Height: 6\' 2"  (188 cm) Weight: 240 lb 8.4 oz (109.1 kg) IBW/kg (Calculated) : 82.2  Vital Signs: Temp: 98.2 F (36.8 C) (02/12 0812) Temp src: Oral (02/12 0812) BP: 114/81 mmHg (02/12 0812) Pulse Rate: 105 (02/12 0812)  Labs:  Recent Labs  05/08/12 1249  05/08/12 1327  05/08/12 1329 05/08/12 1631 05/08/12 2237 05/09/12 0550 05/09/12 1550 05/10/12 0420  HGB 15.2  --  16.7  --   --   --   --  14.3  --  12.8*  HCT 42.8  --  49.0  --   --   --   --  39.8  --  35.9*  PLT 340  --   --   --   --   --   --  264  --  228  LABPROT 22.6*  --   --   --   --   --   --  21.2*  --  22.7*  INR 2.09*  --   --   --   --   --   --  1.92*  --  2.10*  HEPARINUNFRC  --   --   --   --   --   --   --   --  <0.10* 0.53  CREATININE  --   < > 1.60*  --  1.38*  --   --  1.14  --  1.00  TROPONINI  --   --   --   < > <0.30 <0.30 <0.30 <0.30  --   --   < > = values in this interval not displayed.  Estimated Creatinine Clearance: 98.2 ml/min (by C-G formula based on Cr of 1).  Assessment: 67 year old man with h/o AFib, INR subtherapeutic, receving heparin to bridge while coumadin on hold for possible cath. Heparin level is at goal (HL= 0.53) and INT noted at 2.1. Patient noted for cath when INR < 1.7.  Goal of Therapy:  Heparin level 0.3-0.7   Plan:  -Continue heparin at 1850 units/hr -Will check a heparin level later today to confirm at goal  Harland German, Pharm D 05/10/2012 9:17 AM

## 2012-05-10 NOTE — Progress Notes (Signed)
[facility] Progress Note [place] TEAM 1 - Stepdown/ICU Darryl [name] ZOX:[id number] DOB: 01/03/1946 DOA: 05/08/2012 PCP: [name], MD  Brief narrative: 67 year old male patient with chronic kidney disease and chronic systolic heart failure diagnosed initially in January 2013 which was felt to be tachycardia (nonischemic) related. He also has atrial fibrillation and is on chronic Coumadin anticoagulation. He presented to the urgent care on the date of admission with reports of weakness, dizziness, polydipsia and polyuria. He has felt poorly for least one month with decreased appetite and generalized malaise. He's also been experiencing occasional blurred vision upon standing. He was found to be in atrial fibrillation with RVR. He received 2 L of normal saline in urgent care and transferred to [facility] emergency department.  Evaluation in the ER revealed atrial fibrillation with RVR. Metabolic acidosis and hyponatremia and a blood glucose greater than 800 with an anion gap of 15, but with NORMAL bicarb. He was started on an insulin infusion and admitted to the step down unit. Because of his uncontrolled atrial fibrillation a cardiology consultation was obtained.  Assessment/Plan:  Mild DKA, type II vs HONK /  Diabetes mellitus, new onset -AG closed in setting of normal pCO2 so likely was up 2/2 renally mediated acidosis from [facility] -Transitioned from insulin gtt to Lantus - cont.resistant SSI w/HS coverage and meal coverage -CBG still up do increase Lantus from 20-30 units -HgbA1c 15.2 c/w mean plasma glucose of 390 -diabetes educator consult - pt somewhat overwhelmed by educational process and new dx. -pt endorsed does not have PCP/we will need to find one for him  Atrial fibrillation with RVR -rate remains poorly controlled- likely 2/2 ongoing volume depletion -BP soft so HOLD Lopressor for now in favor of Cardizem/use short acting formulation -Continue  digoxin  Warfarin anticoagulation -Cards holding Warfarin in favor of Heparin IV in anticipation of cath this admit -Phamacy managing  Dehydration/  Hyponatremia / Leukocytosis -SBP dropped into the 80's w/standing-better today but w/ persistent tachycardia -Cont. maintenance fluids at 150 cc/hr -All d/t DKA  CKD (chronic kidney disease) stage 2, GFR 60-89 ml/min -Mild acute renal insuff. Due to dehydration- better since admit/fluids  Chronic systolic CHF (congestive heart failure), NYHA class 3/ EF 20-25 %-up to 43% per recent Myoview -Cards plans cath this admit -currently compensated  Right ventricular dilation (moderate)/ ? COPD/  tobacco abuse x45 years -Likely has underlying COPD -ECHO last month also c/w chronic RHF risk-pulmonic valve was poorly visualized but suspect has pulmonary HTN as well.    DVT prophylaxis:  IV heparin Code Status: Full Family Communication: Patient Disposition Plan: Stepdown  Consultants: Cardiology  Procedures: None  Antibiotics: None  HPI/Subjective: Patient without any specific complaints. Denies cp, nv/, abdom pain, or sob.  Is alert and pleasant.  Objective: Blood pressure 123/93, pulse 105, temperature 97.5 F (36.4 C), temperature source Oral, resp. rate 19, height 6\' 2"  (1.88 m), weight 109.1 kg (240 lb 8.4 oz), SpO2 100.00%.  Intake/Output Summary (Last 24 hours) at 05/10/12 1229 Last data filed at 05/10/12 1100  Gross per 24 hour  Intake   3395 ml  Output   1575 ml  Net   1820 ml   Exam: General: No acute respiratory distress Lungs: Clear to auscultation bilaterally without wheezes or crackles, room air Cardiovascular: Irregular rate and atrial fibrillation rhythm without murmur gallop or rub normal S1 and S2, no JVD or peripheral edema, IV fluids at 150 cc per hour Abdomen: Nontender, nondistended, soft, bowel  sounds positive, no rebound, no ascites, no appreciable mass Musculoskeletal: No significant cyanosis,  clubbing of bilateral lower extremities Neurological: Alert and oriented x3, moves all extremities x4 without any appreciable focal deficits, cranial nerves 2-12 grossly intact  Data Reviewed: Basic Metabolic Panel:  Recent Labs Lab 05/08/12 1327 05/08/12 1329 05/09/12 0550 05/10/12 0420  NA 120* 119* 133* 131*  K 6.3* 5.1 3.5 3.8  CL 87* 80* 96 101  CO2  --  24 26 22   GLUCOSE >700* 838* 174* 260*  BUN 29* 26* 20 15  CREATININE 1.60* 1.38* 1.14 1.00  CALCIUM  --  9.7 8.9 8.0*   Liver Function Tests:  Recent Labs Lab 05/09/12 0550  AST 17  ALT 16  ALKPHOS 99  BILITOT 0.4  PROT 6.1  ALBUMIN 3.0*   CBC:  Recent Labs Lab 05/08/12 1249 05/08/12 1327 05/09/12 0550 05/10/12 0420  WBC 11.5*  --  9.8 7.7  NEUTROABS 9.0*  --   --   --   HGB 15.2 16.7 14.3 12.8*  HCT 42.8 49.0 39.8 35.9*  MCV 82.6  --  82.1 84.1  PLT 340  --  264 228   Cardiac Enzymes:  Recent Labs Lab 05/08/12 1329 05/08/12 1631 05/08/12 2237 05/09/12 0550  TROPONINI <0.30 <0.30 <0.30 <0.30   CBG:  Recent Labs Lab 05/09/12 0813 05/09/12 1210 05/09/12 1714 05/09/12 2129 05/10/12 0814  GLUCAP 300* 281* 282* 295* 249*    Recent Results (from the past 240 hour(s))  MRSA PCR SCREENING     Status: None   Collection Time    05/08/12  4:49 PM      Result Value Range Status   MRSA by PCR NEGATIVE  NEGATIVE Final   Comment:            The GeneXpert MRSA Assay (FDA     approved for NASAL specimens     only), is one component of a     comprehensive MRSA colonization     surveillance program. It is not     intended to diagnose MRSA     infection nor to guide or     monitor treatment for     MRSA infections.     Studies:  Recent x-ray studies have been reviewed in detail by the Attending Physician  Scheduled Meds:  Reviewed in detail by the Attending Physician   Junious Silk, ANP [facility] Office  [phone] Pager [phone]  On-Call/Text Page:       [email or web address]      password [email or web address]  If 7PM-7AM, please contact night-coverage [email or web address] Password [facility] 05/10/2012, 12:29 PM   LOS: 2 days    I have personally examined this patient and reviewed the entire database. I have reviewed the above note, made any necessary editorial changes, and agree with its content.  Lonia Blood, MD [facility]

## 2012-05-10 NOTE — Progress Notes (Signed)
Physical Therapy Treatment Patient Details Name: Darryl [name] MRN: [id number] DOB: 12-01-1945 Today's Date: 05/10/2012 Time: 0922-1000 PT Time Calculation (min): 38 min  PT Assessment / Plan / Recommendation Comments on Treatment Session  Pt is a 67 y.o./m admitted for new DM, afib, hyponatremia and has demo's orthostatic BPs. See today for mobility with PT. Orthostatic BP's obtained and reported to RN. Pt able to participate more with therapy with some gait and out of bed to chair today. Still orthostatic with mobiltiy. Limited activity due to elevated HR (afib) 120-159 bpm. Progressing toward his goals today.                 Follow Up Recommendations  No PT follow up           Equipment Recommendations  None recommended by PT       Frequency Min 3X/week   Plan Discharge plan remains appropriate;Frequency remains appropriate    Precautions / Restrictions Precautions Precautions: Other (comment) Precaution Comments: watch BP and HR    Pertinent Vitals/Pain Orthostatic BPs  Supine 09:20 am 106/69 (78)  Sitting 09:27 am 108/44 (53)  Standing  09:29 109/90 (95)  Standing after 4 min 09:32 105/54 (68)  Seated after mobility in room 10:00 115/79 (88)   Heart Rate range with session: 102-159 bpm (pt in afib)    Mobility  Bed Mobility Supine to Sit: 6: Modified independent (Device/Increase time);HOB flat Sit to Supine: 6: Modified independent (Device/Increase time);HOB flat Transfers Sit to Stand: 6: Modified independent (Device/Increase time);From bed;From toilet;With upper extremity assist Stand to Sit: 6: Modified independent (Device/Increase time);To chair/3-in-1;To toilet;With upper extremity assist Details for Transfer Assistance: no cues or assistance needed with transfers. Provided supervision due to vitals only. Ambulation/Gait Ambulation/Gait Assistance: 4: Min guard Ambulation Distance (Feet): 30 Feet Assistive device: Other (Comment) (IV pole at times,  otherwise no AD) Gait Pattern: Step-through pattern;Decreased stride length;Within Functional Limits Gait velocity: Normal    Exercises Standing: heel/toe raises x10 each, alt marches x10 each leg     PT Goals Acute Rehab PT Goals PT Goal: Ambulate - Progress: Progressing toward goal  Visit Information  Last PT Received On: 05/10/12 Assistance Needed: +1    Subjective Data  Subjective: Reports wanting to go into bathroom if possible for BM. No new complaints otherwise and agreeable to therapy at this time.   Cognition  Cognition Overall Cognitive Status: Appears within functional limits for tasks assessed/performed Arousal/Alertness: Awake/alert Orientation Level: Appears intact for tasks assessed Behavior During Session: [facility] for tasks performed       End of Session PT - End of Session Equipment Utilized During Treatment: Gait belt Activity Tolerance: Patient tolerated treatment well Patient left: in chair;with call bell/phone within reach Nurse Communication: Mobility status;Other (comment) (vitals with session)   GP     Sallyanne Kuster 05/10/2012, 10:36 AM  Sallyanne Kuster, PTA Office- [phone]

## 2012-05-11 ENCOUNTER — Encounter (HOSPITAL_COMMUNITY)
Admission: EM | Disposition: A | Discharge status: Home / Self Care | Payer: Self-pay | Source: Home / Self Care | Attending: Internal Medicine

## 2012-05-11 DIAGNOSIS — I2510 Atherosclerotic heart disease of native coronary artery without angina pectoris: Secondary | ICD-10-CM

## 2012-05-11 DIAGNOSIS — E119 Type 2 diabetes mellitus without complications: Secondary | ICD-10-CM

## 2012-05-11 HISTORY — PX: LEFT HEART CATHETERIZATION WITH CORONARY ANGIOGRAM: SHX5451

## 2012-05-11 LAB — BASIC METABOLIC PANEL
CO2: 21 mEq/L (ref 19–32)
Calcium: 8.5 mg/dL (ref 8.4–10.5)
Creatinine, Ser: 0.92 mg/dL (ref 0.50–1.35)
GFR calc non Af Amer: 87 mL/min — ABNORMAL LOW (ref >90)
Glucose, Bld: 182 mg/dL — ABNORMAL HIGH (ref 70–99)
Sodium: 135 mEq/L (ref 135–145)

## 2012-05-11 LAB — CBC
Hemoglobin: 12.8 g/dL — ABNORMAL LOW (ref 13.0–17.0)
MCH: 30.4 pg (ref 26.0–34.0)
MCHC: 35.7 g/dL (ref 30.0–36.0)
MCV: 85.3 fL (ref 78.0–100.0)
Platelets: 212 10*3/uL (ref 150–400)
RBC: 4.21 MIL/uL — ABNORMAL LOW (ref 4.22–5.81)

## 2012-05-11 LAB — PROTIME-INR
INR: 1.34 (ref 0.00–1.49)
Prothrombin Time: 16.3 seconds — ABNORMAL HIGH (ref 11.6–15.2)

## 2012-05-11 SURGERY — LEFT HEART CATHETERIZATION WITH CORONARY ANGIOGRAM
Anesthesia: LOCAL

## 2012-05-11 MED ORDER — HEPARIN (PORCINE) IN NACL 2-0.9 UNIT/ML-% IJ SOLN
INTRAMUSCULAR | Status: AC
  Filled 2012-05-11: qty 1000

## 2012-05-11 MED ORDER — HEPARIN (PORCINE) IN NACL 100-0.45 UNIT/ML-% IJ SOLN
1700.0000 [IU]/h | INTRAMUSCULAR | Status: DC
  Administered 2012-05-11 – 2012-05-12 (×3): 1700 [IU]/h via INTRAVENOUS
  Filled 2012-05-11 (×6): qty 250

## 2012-05-11 MED ORDER — HEPARIN SODIUM (PORCINE) 1000 UNIT/ML IJ SOLN
INTRAMUSCULAR | Status: AC
  Filled 2012-05-11: qty 1

## 2012-05-11 MED ORDER — SODIUM CHLORIDE 0.9 % IV SOLN
INTRAVENOUS | Status: DC
  Administered 2012-05-11: 09:00:00 via INTRAVENOUS

## 2012-05-11 MED ORDER — HEPARIN (PORCINE) IN NACL 2-0.9 UNIT/ML-% IJ SOLN
INTRAMUSCULAR | Status: AC
  Filled 2012-05-11: qty 500

## 2012-05-11 MED ORDER — SODIUM CHLORIDE 0.9 % IV SOLN: 250.0000 mL | INTRAVENOUS | Status: DC | PRN

## 2012-05-11 MED ORDER — SODIUM CHLORIDE 0.9 % IJ SOLN: 3.0000 mL | Freq: Two times a day (BID) | INTRAMUSCULAR | Status: DC

## 2012-05-11 MED ORDER — LIDOCAINE HCL (PF) 1 % IJ SOLN
INTRAMUSCULAR | Status: AC
  Filled 2012-05-11: qty 30

## 2012-05-11 MED ORDER — INSULIN GLARGINE 100 UNIT/ML ~~LOC~~ SOLN
50.0000 [IU] | Freq: Every day | SUBCUTANEOUS | Status: DC
  Administered 2012-05-11: 50 [IU] via SUBCUTANEOUS

## 2012-05-11 MED ORDER — ASPIRIN 81 MG PO CHEW
81.0000 mg | CHEWABLE_TABLET | Freq: Every day | ORAL | Status: DC
  Administered 2012-05-12 – 2012-05-13 (×2): 81 mg via ORAL
  Filled 2012-05-11 (×2): qty 1

## 2012-05-11 MED ORDER — SODIUM CHLORIDE 0.9 % IJ SOLN: 3.0000 mL | INTRAMUSCULAR | Status: DC | PRN

## 2012-05-11 MED ORDER — DIAZEPAM 5 MG PO TABS
5.0000 mg | ORAL_TABLET | ORAL | Status: AC
  Administered 2012-05-11: 5 mg via ORAL
  Filled 2012-05-11: qty 1

## 2012-05-11 MED ORDER — MIDAZOLAM HCL 2 MG/2ML IJ SOLN
INTRAMUSCULAR | Status: AC
  Filled 2012-05-11: qty 2

## 2012-05-11 MED ORDER — WARFARIN - PHARMACIST DOSING INPATIENT: Freq: Every day | Status: DC

## 2012-05-11 MED ORDER — FENTANYL CITRATE 0.05 MG/ML IJ SOLN
INTRAMUSCULAR | Status: AC
  Filled 2012-05-11: qty 2

## 2012-05-11 MED ORDER — VERAPAMIL HCL 2.5 MG/ML IV SOLN
INTRAVENOUS | Status: AC
  Filled 2012-05-11: qty 2

## 2012-05-11 MED ORDER — PNEUMOCOCCAL VAC POLYVALENT 25 MCG/0.5ML IJ INJ
0.5000 mL | INJECTION | INTRAMUSCULAR | Status: AC
  Administered 2012-05-12: 11:00:00 0.5 mL via INTRAMUSCULAR
  Filled 2012-05-11: qty 0.5

## 2012-05-11 MED ORDER — ASPIRIN 81 MG PO CHEW
324.0000 mg | CHEWABLE_TABLET | ORAL | Status: AC
  Administered 2012-05-11: 324 mg via ORAL
  Filled 2012-05-11: qty 4

## 2012-05-11 MED ORDER — WARFARIN SODIUM 5 MG PO TABS
5.0000 mg | ORAL_TABLET | Freq: Once | ORAL | Status: AC
  Administered 2012-05-11: 5 mg via ORAL
  Filled 2012-05-11: qty 1

## 2012-05-11 MED ORDER — SODIUM CHLORIDE 0.9 % IV SOLN
INTRAVENOUS | Status: DC
  Administered 2012-05-11: 20:00:00 via INTRAVENOUS

## 2012-05-11 MED ORDER — SODIUM CHLORIDE 0.9 % IV SOLN: INTRAVENOUS | Status: AC

## 2012-05-11 NOTE — Progress Notes (Signed)
    SUBJECTIVE: No chest pain or SOB this am.   BP 127/81  Pulse 109  Temp(Src) 97.8 F (36.6 C) (Oral)  Resp 21  Ht 6' 2" (1.88 m)  Wt 240 lb 11.9 oz (109.2 kg)  BMI 30.9 kg/m2  SpO2 98%  Intake/Output Summary (Last 24 hours) at 05/11/12 0737 Last data filed at 05/11/12 0413  Gross per 24 hour  Intake 2070.5 ml  Output   2300 ml  Net -229.5 ml    PHYSICAL EXAM General: Well developed, well nourished, in no acute distress. Alert and oriented x 3.  Psych:  Good affect, responds appropriately Neck: No JVD. No masses noted.  Lungs: Clear bilaterally with no wheezes or rhonci noted.  Heart: Irreg irreg with no murmurs noted. Abdomen: Bowel sounds are present. Soft, non-tender.  Extremities: No lower extremity edema.   LABS: Basic Metabolic Panel:  Recent Labs  05/10/12 0420 05/11/12 0540  NA 131* 135  K 3.8 3.6  CL 101 102  CO2 22 21  GLUCOSE 260* 182*  BUN 15 10  CREATININE 1.00 0.92  CALCIUM 8.0* 8.5   CBC:  Recent Labs  05/08/12 1249  05/10/12 0420 05/11/12 0540  WBC 11.5*  < > 7.7 7.4  NEUTROABS 9.0*  --   --   --   HGB 15.2  < > 12.8* 12.8*  HCT 42.8  < > 35.9* 35.9*  MCV 82.6  < > 84.1 85.3  PLT 340  < > 228 212  < > = values in this interval not displayed. Cardiac Enzymes:  Recent Labs  05/08/12 1631 05/08/12 2237 05/09/12 0550  TROPONINI <0.30 <0.30 <0.30   Current Meds: . aspirin  81 mg Oral Daily  . digoxin  0.125 mg Oral Daily  . diltiazem  30 mg Oral Q6H  . insulin aspart  0-20 Units Subcutaneous TID WC  . insulin aspart  0-5 Units Subcutaneous QHS  . insulin aspart  8 Units Subcutaneous TID WC  . insulin glargine  45 Units Subcutaneous QHS  . sodium chloride  3 mL Intravenous Q12H     ASSESSMENT AND PLAN:  1. Atrial fibrillation: Chronic. Coumadin is being held in anticipation of cardiac cath. Moderate rate control with digoxin and Cardizem po.   2. Chronic systolic CHF: LVEF 20-25% by echo last month. Acute decompensation  likely due to rapid HR which is now controlled. Volume status is ok. INR is 1.3 this am. Will plan cardiac cath today to exclude CAD. Heparin drip has been started.     MCALHANY,CHRISTOPHER  2/13/20147:37 AM  

## 2012-05-11 NOTE — Progress Notes (Signed)
ANTICOAGULATION CONSULT NOTE Pharmacy Consult for heparin Indication: atrial fibrillation  No Known Allergies  Patient Measurements: Height: 6\' 2"  (188 cm) Weight: 240 lb 11.9 oz (109.2 kg) IBW/kg (Calculated) : 82.2  Vital Signs: Temp: 98 F (36.7 C) (02/13 0805) Temp src: Oral (02/13 0805) BP: 122/74 mmHg (02/13 0805) Pulse Rate: 109 (02/13 0905)  Labs:  Recent Labs  05/08/12 1631 05/08/12 2237 05/09/12 0550  05/10/12 0420 05/10/12 1239 05/11/12 0540  HGB  --   --  14.3  --  12.8*  --  12.8*  HCT  --   --  39.8  --  35.9*  --  35.9*  PLT  --   --  264  --  228  --  212  LABPROT  --   --  21.2*  --  22.7*  --  16.3*  INR  --   --  1.92*  --  2.10*  --  1.34  HEPARINUNFRC  --   --   --   < > 0.53 0.71* 0.63  CREATININE  --   --  1.14  --  1.00  --  0.92  TROPONINI <0.30 <0.30 <0.30  --   --   --   --   < > = values in this interval not displayed.  Estimated Creatinine Clearance: 106.7 ml/min (by C-G formula based on Cr of 0.92).  Assessment: 67 year old man with h/o AFib, INR subtherapeutic, receving heparin to bridge, now s/p cath. Heparin level was therapeutic this morning on 1750 units/hr, INR down 1.34. Dr. Clifton Andrue ordered to resume heparin 8 hrs after sheath removal (4098), and resume coumadin today.  Home coumadin dose: 5mg  daily  Goal of Therapy:  Heparin level 0.3-0.7   Plan:  - Restart heparin infusion without bolus 1700 units/hr at 1800 - f/u 6 hr heparin level at midnight. - Coumadin 5mg  po x 1 tonight - f/u daily PT/INR  Bayard Hugger, PharmD, BCPS  Clinical Pharmacist  Pager: [phone]

## 2012-05-11 NOTE — Progress Notes (Signed)
[facility] Progress Note  TEAM 1 - Stepdown/ICU Conn Trombetta ZOX:[id number] DOB: 08-04-1945 DOA: 05/08/2012 PCP: [name], MD  Brief narrative: 67 year old male patient with chronic kidney disease and chronic systolic heart failure diagnosed initially in January 2013 which was felt to be tachycardia (nonischemic) related. He also has atrial fibrillation and is on chronic Coumadin anticoagulation. He presented to the urgent care on the date of admission with reports of weakness, dizziness, polydipsia and polyuria. He has felt poorly for least one month with decreased appetite and generalized malaise. He's also been experiencing occasional blurred vision upon standing. He was found to be in atrial fibrillation with RVR. He received 2 L of normal saline in urgent care and transferred to [facility] emergency department.  Evaluation in the ER revealed atrial fibrillation with RVR. Metabolic acidosis and hyponatremia and a blood glucose greater than 800 with an anion gap of 15, but with NORMAL bicarb. He was started on an insulin infusion and admitted to the step down unit. Because of his uncontrolled atrial fibrillation a cardiology consultation was obtained.  Assessment/Plan:  Mild DKA, type II vs HONK /  Diabetes mellitus, new onset -AG closed in setting of normal pCO2 so likely was up 2/2 renally mediated acidosis from [facility] -Transitioned from insulin gtt to Lantus - cont.resistant SSI w/HS coverage and meal coverage -Will increase Lantus further to 50 units and NovoLog to 8 units with meals -HgbA1c 15.2 c/w mean plasma glucose of 390 -diabetes educator consult - pt somewhat overwhelmed by educational process and new dx. -pt endorsed does not have PCP/we will need to find one for him  Atrial fibrillation with RVR -rate remains moderately controlled -BP soft so HOLD Lopressor for now in favor of Cardizem/use short acting formulation -Continue digoxin  Warfarin  anticoagulation -Cards holding Warfarin -now post cath/cards rec. Resume Heparin 8 hrs post cath -Phamacy managing  Dehydration/  Hyponatremia / Leukocytosis -SBP dropped into the 80's w/standing-better today but w/ persistent tachycardia -Cont. maintenance fluids at 150 cc/hr -All d/t DKA  CKD (chronic kidney disease) stage 2, GFR 60-89 ml/min -Mild acute renal insuff. Due to dehydration- better since admit/fluids  Chronic systolic CHF (congestive heart failure), NYHA class 3/ EF 20-25 %-up to 43% per recent Myoview -Cath 2/13 confirms non-ischemic CM -currently compensated  Right ventricular dilation (moderate)/ ? COPD/  tobacco abuse x45 years -Likely has underlying COPD -ECHO last month also c/w chronic RHF risk-pulmonic valve was poorly visualized but suspect has pulmonary HTN as well.    DVT prophylaxis:  IV heparin Code Status: Full Family Communication: Patient Disposition Plan: Transfer to telemetry  Consultants: Cardiology  Procedures: Cardiac catheterization (05/11/12) Impression:  1. Mild non-obstructive CAD  2. Non-ischemic cardiomyopathy   Antibiotics: None  HPI/Subjective: Patient without any specific complaints. Denies cp, nv/, abdom pain, or sob.  Is alert and pleasant.  Objective: Blood pressure 122/74, pulse 109, temperature 98 F (36.7 C), temperature source Oral, resp. rate 17, height 6\' 2"  (1.88 m), weight 109.2 kg (240 lb 11.9 oz), SpO2 99.00%.  Intake/Output Summary (Last 24 hours) at 05/11/12 1248 Last data filed at 05/11/12 0413  Gross per 24 hour  Intake   1110 ml  Output   2300 ml  Net  -1190 ml   Exam: General: No acute respiratory distress Lungs: Clear to auscultation bilaterally without wheezes or crackles, room air Cardiovascular: Irregular rate and atrial fibrillation rhythm without murmur gallop or rub normal S1 and S2, no JVD or peripheral  edema, IV fluids at 150 cc per hour Abdomen: Nontender, nondistended, soft, bowel sounds  positive, no rebound, no ascites, no appreciable mass Musculoskeletal: No significant cyanosis, clubbing of bilateral lower extremities Neurological: Alert and oriented x3, moves all extremities x4 without any appreciable focal deficits, cranial nerves 2-12 grossly intact  Data Reviewed: Basic Metabolic Panel:  Recent Labs Lab 05/08/12 1327 05/08/12 1329 05/09/12 0550 05/10/12 0420 05/11/12 0540  NA 120* 119* 133* 131* 135  K 6.3* 5.1 3.5 3.8 3.6  CL 87* 80* 96 101 102  CO2  --  24 26 22 21   GLUCOSE >700* 838* 174* 260* 182*  BUN 29* 26* 20 15 10   CREATININE 1.60* 1.38* 1.14 1.00 0.92  CALCIUM  --  9.7 8.9 8.0* 8.5   Liver Function Tests:  Recent Labs Lab 05/09/12 0550  AST 17  ALT 16  ALKPHOS 99  BILITOT 0.4  PROT 6.1  ALBUMIN 3.0*   CBC:  Recent Labs Lab 05/08/12 1249 05/08/12 1327 05/09/12 0550 05/10/12 0420 05/11/12 0540  WBC 11.5*  --  9.8 7.7 7.4  NEUTROABS 9.0*  --   --   --   --   HGB 15.2 16.7 14.3 12.8* 12.8*  HCT 42.8 49.0 39.8 35.9* 35.9*  MCV 82.6  --  82.1 84.1 85.3  PLT 340  --  264 228 212   Cardiac Enzymes:  Recent Labs Lab 05/08/12 1329 05/08/12 1631 05/08/12 2237 05/09/12 0550  TROPONINI <0.30 <0.30 <0.30 <0.30   CBG:  Recent Labs Lab 05/10/12 1215 05/10/12 1741 05/10/12 2208 05/11/12 0751 05/11/12 1012  GLUCAP 206* 210* 226* 180* 179*    Recent Results (from the past 240 hour(s))  MRSA PCR SCREENING     Status: None   Collection Time    05/08/12  4:49 PM      Result Value Range Status   MRSA by PCR NEGATIVE  NEGATIVE Final   Comment:            The GeneXpert MRSA Assay (FDA     approved for NASAL specimens     only), is one component of a     comprehensive MRSA colonization     surveillance program. It is not     intended to diagnose MRSA     infection nor to guide or     monitor treatment for     MRSA infections.     Studies:  Recent x-ray studies have been reviewed in detail by the Attending  Physician  Scheduled Meds:  Reviewed in detail by the Attending Physician   Junious Silk, ANP [facility] Office  [phone] Pager [phone]  On-Call/Text Page:      [email or web address]      password [email or web address]  If 7PM-7AM, please contact night-coverage [email or web address] Password [facility] 05/11/2012, 12:48 PM   LOS: 3 days    I have examined the patient, reviewed the chart and modified the above note which I agree with.   Naika Noto,MD [phone] 05/11/2012, 4:39 PM

## 2012-05-11 NOTE — Interval H&P Note (Signed)
History and Physical Interval Note:  05/11/2012 9:23 AM  Darryl [name]  has presented today for cardiac cath with the diagnosis of cardiomyopathy.  The various methods of treatment have been discussed with the patient and family. After consideration of risks, benefits and other options for treatment, the patient has consented to  Procedure(s): LEFT HEART CATHETERIZATION WITH CORONARY ANGIOGRAM (N/A) as a surgical intervention .  The patient's history has been reviewed, patient examined, no change in status, stable for surgery.  I have reviewed the patient's chart and labs.  Questions were answered to the patient's satisfaction.     MCALHANY,CHRISTOPHER

## 2012-05-11 NOTE — Progress Notes (Signed)
MD in room. Decide to do catherization. Pt educated by MD in room. This nurse played Cardiac catherization video pt had no questions after.  Right and Left radial clipped and cleaned. ASA 324 given and Valium 5mg .  Packed dentures, cell phone, clothes and all belongings and sent with pt. Made pt aware that denture cup is in shoe and phone in jeans and all belongings sent with pt.

## 2012-05-11 NOTE — Progress Notes (Signed)
ANTICOAGULATION CONSULT NOTE Pharmacy Consult for heparin Indication: atrial fibrillation  No Known Allergies  Patient Measurements: Height: 6\' 2"  (188 cm) Weight: 240 lb 11.9 oz (109.2 kg) IBW/kg (Calculated) : 82.2  Vital Signs: Temp: 98 F (36.7 C) (02/13 0805) Temp src: Oral (02/13 0805) BP: 122/74 mmHg (02/13 0805) Pulse Rate: 94 (02/13 0805)  Labs:  Recent Labs  05/08/12 1631 05/08/12 2237 05/09/12 0550  05/10/12 0420 05/10/12 1239 05/11/12 0540  HGB  --   --  14.3  --  12.8*  --  12.8*  HCT  --   --  39.8  --  35.9*  --  35.9*  PLT  --   --  264  --  228  --  212  LABPROT  --   --  21.2*  --  22.7*  --  16.3*  INR  --   --  1.92*  --  2.10*  --  1.34  HEPARINUNFRC  --   --   --   < > 0.53 0.71* 0.63  CREATININE  --   --  1.14  --  1.00  --  0.92  TROPONINI <0.30 <0.30 <0.30  --   --   --   --   < > = values in this interval not displayed.  Estimated Creatinine Clearance: 106.7 ml/min (by C-G formula based on Cr of 0.92).  Assessment: 67 year old man with h/o AFib, INR subtherapeutic, receving heparin to bridge while coumadin on hold for cath. Heparin level is therapeutic.  Goal of Therapy:  Heparin level 0.3-0.7   Plan:   Heparin 1750 units/hr F/u after cath

## 2012-05-11 NOTE — CV Procedure (Addendum)
    Cardiac Catheterization Operative Report  Darryl [name] [id number] 2/13/20149:53 AM BELFI, Shawna Orleans, MD  Procedure Performed:  1. Left Heart Catheterization 2. Selective Coronary Angiography 3. Left ventricular pressures  Operator: Verne Carrow, MD  Arterial access site:  Right radial artery.   Indication:   67 yo male with history of NICM (felt to be tachy related) admitted with DKA, dehydration, ARF. Given his LV systolic dysfunction and EKG changes, cardiac cath to exclude obstructive  CAD.                                  Procedure Details: The risks, benefits, complications, treatment options, and expected outcomes were discussed with the patient. The patient and/or family concurred with the proposed plan, giving informed consent. The patient was brought to the cath lab after IV hydration was begun and oral premedication was given. The patient was further sedated with Versed and Fentanyl. The right wrist was assessed with an Allens test which was positive. The right wrist was prepped and draped in a sterile fashion. 1% lidocaine was used for local anesthesia. Using the modified Seldinger access technique, a 5 French sheath was placed in the right radial artery. 3 mg Verapamil was given through the sheath. 5000 units IV heparin was given. A JR-4 catheter was used to perform selective coronary angiography. Both the RCA and left main were easily engaged with the JR-4 catheter. The JR-4 was used to cross the aortic valve into the LV. LV pressures were measured. All catheter exchanges were over a wire. The sheath was removed from the right radial artery and a Terumo hemostasis band was applied at the arteriotomy site on the right wrist.   There were no immediate complications. The patient was taken to the recovery area in stable condition.   Hemodynamic Findings: Central aortic pressure: 120/78 Left ventricular pressure: 117/10/17  Angiographic Findings:  Left main:  No  obstructive disease noted.   Left Anterior Descending Artery: Large caliber vessel that courses to the apex. The proximal and mid vessel has mild, diffuse plaque with 20% stenoses in both segments. The distal LAD has mild plaque disease. There are several very small caliber diagonal branches with mild plaque disease.   Circumflex Artery:  Moderate caliber vessel with a moderate sized OM branch. The OM branch has 30% proximal stenosis.   Right Coronary Artery: Large dominant vessel with mild calcification noted throughout the proximal and mid vessel. The mid vessel has mild luminal irregularities. The PDA is small to moderate in caliber and has mild luminal irregularities. The posterolateral branch is moderate sized and is free of any significant disease.   Left Ventricular Angiogram: Deferred.   Impression: 1. Mild non-obstructive CAD 2. Non-ischemic cardiomyopathy  Recommendations: Continued medical management of mild CAD and non-ischemic cardiomyopathy. Will resume heparin 8 hours post sheath pull. Will restart coumadin today. Attempt rate control with Digoxin and Diltiazem po Q6 hours.        Complications:  None. The patient tolerated the procedure well.

## 2012-05-11 NOTE — H&P (View-Only) (Signed)
    SUBJECTIVE: No chest pain or SOB this am.   BP 127/81  Pulse 109  Temp(Src) 97.8 F (36.6 C) (Oral)  Resp 21  Ht 6\' 2"  (1.88 m)  Wt 240 lb 11.9 oz (109.2 kg)  BMI 30.9 kg/m2  SpO2 98%  Intake/Output Summary (Last 24 hours) at 05/11/12 0737 Last data filed at 05/11/12 0413  Gross per 24 hour  Intake 2070.5 ml  Output   2300 ml  Net -229.5 ml    PHYSICAL EXAM General: Well developed, well nourished, in no acute distress. Alert and oriented x 3.  Psych:  Good affect, responds appropriately Neck: No JVD. No masses noted.  Lungs: Clear bilaterally with no wheezes or rhonci noted.  Heart: Irreg irreg with no murmurs noted. Abdomen: Bowel sounds are present. Soft, non-tender.  Extremities: No lower extremity edema.   LABS: Basic Metabolic Panel:  Recent Labs  16/10/96 0420 05/11/12 0540  NA 131* 135  K 3.8 3.6  CL 101 102  CO2 22 21  GLUCOSE 260* 182*  BUN 15 10  CREATININE 1.00 0.92  CALCIUM 8.0* 8.5   CBC:  Recent Labs  05/08/12 1249  05/10/12 0420 05/11/12 0540  WBC 11.5*  < > 7.7 7.4  NEUTROABS 9.0*  --   --   --   HGB 15.2  < > 12.8* 12.8*  HCT 42.8  < > 35.9* 35.9*  MCV 82.6  < > 84.1 85.3  PLT 340  < > 228 212  < > = values in this interval not displayed. Cardiac Enzymes:  Recent Labs  05/08/12 1631 05/08/12 2237 05/09/12 0550  TROPONINI <0.30 <0.30 <0.30   Current Meds: . aspirin  81 mg Oral Daily  . digoxin  0.125 mg Oral Daily  . diltiazem  30 mg Oral Q6H  . insulin aspart  0-20 Units Subcutaneous TID WC  . insulin aspart  0-5 Units Subcutaneous QHS  . insulin aspart  8 Units Subcutaneous TID WC  . insulin glargine  45 Units Subcutaneous QHS  . sodium chloride  3 mL Intravenous Q12H     ASSESSMENT AND PLAN:  1. Atrial fibrillation: Chronic. Coumadin is being held in anticipation of cardiac cath. Moderate rate control with digoxin and Cardizem po.   2. Chronic systolic CHF: LVEF 20-25% by echo last month. Acute decompensation  likely due to rapid HR which is now controlled. Volume status is ok. INR is 1.3 this am. Will plan cardiac cath today to exclude CAD. Heparin drip has been started.     Dandria Griego  2/13/20147:37 AM

## 2012-05-12 DIAGNOSIS — E871 Hypo-osmolality and hyponatremia: Secondary | ICD-10-CM

## 2012-05-12 DIAGNOSIS — N182 Chronic kidney disease, stage 2 (mild): Secondary | ICD-10-CM

## 2012-05-12 LAB — CBC
HCT: 37.9 % — ABNORMAL LOW (ref 39.0–52.0)
Hemoglobin: 13.2 g/dL (ref 13.0–17.0)
MCV: 85.2 fL (ref 78.0–100.0)
RBC: 4.45 MIL/uL (ref 4.22–5.81)
WBC: 7.6 10*3/uL (ref 4.0–10.5)

## 2012-05-12 LAB — HEPARIN LEVEL (UNFRACTIONATED): Heparin Unfractionated: 0.45 IU/mL (ref 0.30–0.70)

## 2012-05-12 LAB — GLUCOSE, CAPILLARY: Glucose-Capillary: 180 mg/dL — ABNORMAL HIGH (ref 70–99)

## 2012-05-12 MED ORDER — METOPROLOL TARTRATE 50 MG PO TABS
50.0000 mg | ORAL_TABLET | Freq: Two times a day (BID) | ORAL | Status: DC
  Administered 2012-05-12 – 2012-05-13 (×3): 50 mg via ORAL
  Filled 2012-05-12 (×4): qty 1

## 2012-05-12 MED ORDER — WARFARIN SODIUM 7.5 MG PO TABS
7.5000 mg | ORAL_TABLET | Freq: Once | ORAL | Status: AC
  Administered 2012-05-12: 7.5 mg via ORAL
  Filled 2012-05-12: qty 1

## 2012-05-12 MED ORDER — LIVING WELL WITH DIABETES BOOK
Freq: Once | Status: DC
  Filled 2012-05-12: qty 1

## 2012-05-12 MED ORDER — INSULIN GLARGINE 100 UNIT/ML ~~LOC~~ SOLN
55.0000 [IU] | Freq: Every day | SUBCUTANEOUS | Status: DC
  Administered 2012-05-12: 55 [IU] via SUBCUTANEOUS

## 2012-05-12 NOTE — Progress Notes (Signed)
    SUBJECTIVE: No chest pain or SOB this am.   Cath showed non-obstructive CAD.   BP 123/90  Pulse 73  Temp(Src) 98.2 F (36.8 C) (Oral)  Resp 23  Ht 6\' 2"  (1.88 m)  Wt 239 lb 13.8 oz (108.8 kg)  BMI 30.78 kg/m2  SpO2 98%  Intake/Output Summary (Last 24 hours) at 05/12/12 0850 Last data filed at 05/12/12 0600  Gross per 24 hour  Intake 1176.83 ml  Output   1925 ml  Net -748.17 ml    PHYSICAL EXAM General: Well developed, well nourished, in no acute distress. Alert and oriented x 3.  Psych:  Good affect, responds appropriately Neck: No JVD. No masses noted.  Lungs: Clear bilaterally with no wheezes or rhonci noted.  Heart: Irreg irreg with no murmurs noted. Abdomen: Bowel sounds are present. Soft, non-tender.  Extremities: No lower extremity edema.   LABS: Basic Metabolic Panel:  Recent Labs  16/10/96 0420 05/11/12 0540  NA 131* 135  K 3.8 3.6  CL 101 102  CO2 22 21  GLUCOSE 260* 182*  BUN 15 10  CREATININE 1.00 0.92  CALCIUM 8.0* 8.5   CBC:  Recent Labs  05/11/12 0540 05/12/12 0204  WBC 7.4 7.6  HGB 12.8* 13.2  HCT 35.9* 37.9*  MCV 85.3 85.2  PLT 212 213   Cardiac Enzymes: No results found for this basename: CKTOTAL, CKMB, CKMBINDEX, TROPONINI,  in the last 72 hours Current Meds: . aspirin  81 mg Oral Daily  . digoxin  0.125 mg Oral Daily  . diltiazem  30 mg Oral Q6H  . insulin aspart  0-20 Units Subcutaneous TID WC  . insulin aspart  0-5 Units Subcutaneous QHS  . insulin aspart  8 Units Subcutaneous TID WC  . insulin glargine  50 Units Subcutaneous QHS  . living well with diabetes book   Does not apply Once  . pneumococcal 23 valent vaccine  0.5 mL Intramuscular Tomorrow-1000  . Warfarin - Pharmacist Dosing Inpatient   Does not apply q1800     ASSESSMENT AND PLAN:  1. Atrial fibrillation: Chronic. Coumadin has been restarted.  I would favor metoprolol instead of diltiazem for rate control since he has chronic systolic heart failure.    2. Chronic systolic CHF: LVEF 20-25% by echo last month. Acute decompensation likely due to rapid HR .  He was on metoprolol at home.  This would be a better long term medication for him given his low EF.  Will start Metoprolol 50 BID and DC Diltiazem.  We can give him small doses of metoprolol PRN  if needed for rate control during the transition to metoprolol.    He has metoprolol 100 mg tabs at home.   Elyn Aquas.  2/14/20148:50 AM

## 2012-05-12 NOTE — Plan of Care (Signed)
Problem: Phase I Progression Outcomes Goal: Other Phase I Outcomes/Goals Outcome: Completed/Met Date Met:  05/12/12 Pt watched diab videos # [phone]

## 2012-05-12 NOTE — Progress Notes (Signed)
Physical Therapy Treatment Patient Details Name: Abdias Hickam MRN: [id number] DOB: 30-May-1945 Today's Date: 05/12/2012 Time: 1012-1028 PT Time Calculation (min): 16 min  PT Assessment / Plan / Recommendation Comments on Treatment Session  Pt adm with new DM, Afib.  Pt no longer orthostatic.  Mobility much improved and ready for return home from PT standpoing.    Follow Up Recommendations  No PT follow up     Does the patient have the potential to tolerate intense rehabilitation     Barriers to Discharge        Equipment Recommendations  None recommended by PT    Recommendations for Other Services    Frequency     Plan Discharge plan remains appropriate;All goals met and education completed, patient dischaged from PT services    Precautions / Restrictions Precautions Precautions: None   Pertinent Vitals/Pain HR 120's with activity    Mobility  Transfers Sit to Stand: 7: Independent Stand to Sit: 7: Independent Ambulation/Gait Ambulation/Gait Assistance: 7: Independent Ambulation Distance (Feet): 250 Feet Assistive device: None Gait Pattern: Within Functional Limits Gait velocity: Normal    Exercises     PT Diagnosis:    PT Problem List:   PT Treatment Interventions:     PT Goals Acute Rehab PT Goals PT Goal: Ambulate - Progress: Met PT Goal: Up/Down Stairs - Progress: Other (comment) (verbalized understanding)  Visit Information  Last PT Received On: 05/12/12 Assistance Needed: +1    Subjective Data  Subjective: Pt reports he feels much better.   Cognition  Cognition Overall Cognitive Status: Appears within functional limits for tasks assessed/performed Arousal/Alertness: Awake/alert Orientation Level: Appears intact for tasks assessed Behavior During Session: [facility] for tasks performed    Balance  Static Standing Balance Static Standing - Balance Support: No upper extremity supported;During functional activity Static Standing - Level of Assistance:  7: Independent  End of Session PT - End of Session Activity Tolerance: Patient tolerated treatment well Patient left: in chair;with call bell/phone within reach Nurse Communication: Mobility status   GP     [facility] 05/12/2012, 10:45 AM  [facility] PT [phone]

## 2012-05-12 NOTE — Progress Notes (Signed)
LOS reviewed. Jissel Slavens J. Lucretia Roers, RN, BSN, [employer] [phone]

## 2012-05-12 NOTE — Progress Notes (Addendum)
[facility] Progress Note    Darryl [name] WJX:[id number] DOB: May 31, 1945 DOA: 05/08/2012 PCP: [name], MD  Brief narrative: 67 year old male patient with chronic kidney disease and chronic systolic heart failure diagnosed initially in January 2013 which was felt to be tachycardia (nonischemic) related. He also has atrial fibrillation and is on chronic Coumadin anticoagulation. He presented to the urgent care on the date of admission with reports of weakness, dizziness, polydipsia and polyuria. He has felt poorly for least one month with decreased appetite and generalized malaise. He's also been experiencing occasional blurred vision upon standing. He was found to be in atrial fibrillation with RVR. He received 2 L of normal saline in urgent care and transferred to [facility] emergency department.  Evaluation in the ER revealed atrial fibrillation with RVR. Metabolic acidosis and hyponatremia and a blood glucose greater than 800 with an anion gap of 15, but with NORMAL bicarb. He was started on an insulin infusion and admitted to the step down unit. Because of his uncontrolled atrial fibrillation a cardiology consultation was obtained.  Assessment/Plan:  Mild DKA, type II vs HONK /  Diabetes mellitus, new onset -AG closed in setting of normal pCO2 so likely was up 2/2 renally mediated acidosis from Dehydration and acute renal failureH -Transitioned from insulin gtt to Lantus and SSI; also with meal coverage -Will increase Lantus further to 55 units and continue NovoLog to 8 units with meals (changes made base on SSI needs) -HgbA1c 15.2 c/w mean plasma glucose of 390 -will adjust lantus; needs PCP and close monitoring as an outpatient.  Atrial fibrillation with RVR -rate remains controlled -BP normal now after IVF's -resume lopressor -Continue digoxin  Warfarin anticoagulation -Cards ok to resume coumadin -Currently on heparin drip with transition -chronic a. Fib; rate  controlled (not need to be therapeutic or to overlap with anticoagulation therapy for this chronic condition).  Dehydration/  Hyponatremia / Leukocytosis -positive orthostatic changes on admission and on 2/13 -normal BP today; no dizziness or any other complaints today. 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 Due to dehydration and hypotension- improved and back to baseline after IVF's. -Fluids change to [facility] -will monitor.  Chronic systolic CHF (congestive heart failure), NYHA class 3/ EF 20-25 %-up to 43% per recent Myoview -Cath 2/13 confirms non-ischemic CM -currently compensated -Continue BB, continue ASA and digoxin  Right ventricular dilation (moderate)/ ? COPD/  tobacco abuse x45 years -Likely has underlying COPD -ECHO last month also c/w chronic RHF risk-pulmonic valve was poorly visualized but suspect has pulmonary HTN as well. -Continue current treatment and follow with pulmonary service as an outpatient.  Hyperkalemia: -resolved    DVT prophylaxis:  IV heparin while transitioning back to coumadin. Code Status: Full Family Communication: Patient Disposition Plan: continue observation on telemetry bed; most likely home in 1-2 days.  Consultants: Cardiology  Procedures: Cardiac catheterization (05/11/12) Impression:  1. Mild non-obstructive CAD  2. Positive Non-ischemic cardiomyopathy  Antibiotics: None  HPI/Subjective: Patient without any specific complaints. Denies cp, nv/, abdom pain, or sob.  Is AAOX3  Objective: Blood pressure 115/85, pulse 129, temperature 98.2 F (36.8 C), temperature source Oral, resp. rate 23, height 6\' 2"  (1.88 m), weight 108.8 kg (239 lb 13.8 oz), SpO2 98.00%.  Intake/Output Summary (Last 24 hours) at 05/12/12 1449 Last data filed at 05/12/12 0900  Gross per 24 hour  Intake 1176.83 ml  Output  2225 ml  Net -1048.17 ml    Exam: General: NAD; denies CP or SOB. Lungs: Clear to auscultation bilaterally without wheezes or crackles, Good O2 sat on room air Cardiovascular: Irregular rate and atrial fibrillation rhythm without on telemetry; no murmur, gallop or rub, no JVD or peripheral edema, IV fluids at [facility] Abdomen: Nontender, nondistended, soft, bowel sounds positive, no rebound, no ascites, no appreciable mass Musculoskeletal: No significant cyanosis, clubbing of bilateral lower extremities Neurological: Alert and oriented x3, moves all extremities x4 without any appreciable focal deficits, cranial nerves 2-12 grossly intact  Data Reviewed: Basic Metabolic Panel:  Recent Labs Lab 05/08/12 1327 05/08/12 1329 05/09/12 0550 05/10/12 0420 05/11/12 0540  NA 120* 119* 133* 131* 135  K 6.3* 5.1 3.5 3.8 3.6  CL 87* 80* 96 101 102  CO2  --  24 26 22 21   GLUCOSE >700* 838* 174* 260* 182*  BUN 29* 26* 20 15 10   CREATININE 1.60* 1.38* 1.14 1.00 0.92  CALCIUM  --  9.7 8.9 8.0* 8.5   Liver Function Tests:  Recent Labs Lab 05/09/12 0550  AST 17  ALT 16  ALKPHOS 99  BILITOT 0.4  PROT 6.1  ALBUMIN 3.0*   CBC:  Recent Labs Lab 05/08/12 1249 05/08/12 1327 05/09/12 0550 05/10/12 0420 05/11/12 0540 05/12/12 0204  WBC 11.5*  --  9.8 7.7 7.4 7.6  NEUTROABS 9.0*  --   --   --   --   --   HGB 15.2 16.7 14.3 12.8* 12.8* 13.2  HCT 42.8 49.0 39.8 35.9* 35.9* 37.9*  MCV 82.6  --  82.1 84.1 85.3 85.2  PLT 340  --  264 228 212 213   Cardiac Enzymes:  Recent Labs Lab 05/08/12 1329 05/08/12 1631 05/08/12 2237 05/09/12 0550  TROPONINI <0.30 <0.30 <0.30 <0.30   CBG:  Recent Labs Lab 05/11/12 1314 05/11/12 1728 05/11/12 2126 05/12/12 0815 05/12/12 1324  GLUCAP 201* 207* 156* 180* 216*    Recent Results (from the past 240 hour(s))  MRSA PCR SCREENING     Status: None   Collection Time    05/08/12  4:49 PM      Result Value Range Status   MRSA by PCR NEGATIVE  NEGATIVE Final   Comment:             The GeneXpert MRSA Assay (FDA     approved for NASAL specimens     only), is one component of a     comprehensive MRSA colonization     surveillance program. It is not     intended to diagnose MRSA     infection nor to guide or     monitor treatment for     MRSA infections.       If 7PM-7AM, please contact night-coverage [email or web address] Password [email or web address] 05/12/2012, 2:49 PM   LOS: 4 days    Jeffrey Voth,MD [phone] 05/12/2012, 2:49 PM

## 2012-05-12 NOTE — Progress Notes (Signed)
ANTICOAGULATION CONSULT NOTE - Follow Up Consult  Pharmacy Consult for Heparin + Warfarin Indication: Hx Afib  No Known Allergies  Patient Measurements: Height: 6\' 2"  (188 cm) Weight: 239 lb 13.8 oz (108.8 kg) IBW/kg (Calculated) : 82.2 Heparin Dosing Weight: 104.5 kg  Vital Signs: Temp: 98.2 F (36.8 C) (02/14 0735) Temp src: Oral (02/14 0735) BP: 115/85 mmHg (02/14 1018) Pulse Rate: 129 (02/14 1018)  Labs:  Recent Labs  05/10/12 0420  05/11/12 0540 05/12/12 0204 05/12/12 0935  HGB 12.8*  --  12.8* 13.2  --   HCT 35.9*  --  35.9* 37.9*  --   PLT 228  --  212 213  --   LABPROT 22.7*  --  16.3* 14.9  --   INR 2.10*  --  1.34 1.19  --   HEPARINUNFRC 0.53  < > 0.63 0.41 0.45  CREATININE 1.00  --  0.92  --   --   < > = values in this interval not displayed.  Estimated Creatinine Clearance: 106.5 ml/min (by C-G formula based on Cr of 0.92).   Medications:  Heparin at 1700 units/hr  Assessment: 67 y.o. M on heparin bridge to therapeutic INR with warfarin for hx Afib. Heparin level this morning is therapeutic (HL 0.45, goal of 0.3-0.7). INR remains SUBtherapeutic however just resumed warfarin yesterday evening (INR 1.19 << 1.34, goal of 2-3). Hgb/Hct/Plt stable. No s/sx of bleeding noted.  Goal of Therapy:  INR 2-3 Heparin level 0.3-0.7 units/ml Monitor platelets by anticoagulation protocol: Yes   Plan:  1. Continue heparin at 1700 units/hr (17 ml/hr) 2. Warfarin 7.5 mg x 1 dose at 1800 today 3. Will continue to monitor for any signs/symptoms of bleeding and will follow up with heparin level and PT/INR in the a.m.   Georgina Pillion, PharmD, BCPS Clinical Pharmacist Pager: [phone] 05/12/2012 10:57 AM

## 2012-05-12 NOTE — Progress Notes (Signed)
Utilization Review Completed Ajeenah Heiny J. Bleu Minerd, RN, BSN, NCM [phone]  

## 2012-05-12 NOTE — Progress Notes (Signed)
ANTICOAGULATION CONSULT NOTE - Follow Up Consult  Pharmacy Consult for heparin Indication: atrial fibrillation  Labs:  Recent Labs  05/09/12 0550  05/10/12 0420 05/10/12 1239 05/11/12 0540 05/12/12 0204  HGB 14.3  --  12.8*  --  12.8* 13.2  HCT 39.8  --  35.9*  --  35.9* 37.9*  PLT 264  --  228  --  212 213  LABPROT 21.2*  --  22.7*  --  16.3* 14.9  INR 1.92*  --  2.10*  --  1.34 1.19  HEPARINUNFRC  --   < > 0.53 0.71* 0.63 0.41  CREATININE 1.14  --  1.00  --  0.92  --   TROPONINI <0.30  --   --   --   --   --   < > = values in this interval not displayed.  Estimated Creatinine Clearance: 106.5 ml/min (by C-G formula based on Cr of 0.92).   Assessment/Plan:  67yo male therapeutic on heparin after resumed post-cath.  Will continue gtt at current rate and confirm stable with additional level.  Colleen Can PharmD BCPS 05/12/2012,2:50 AM

## 2012-05-13 DIAGNOSIS — I10 Essential (primary) hypertension: Secondary | ICD-10-CM

## 2012-05-13 LAB — CBC
HCT: 37.7 % — ABNORMAL LOW (ref 39.0–52.0)
Platelets: 210 10*3/uL (ref 150–400)
RBC: 4.37 MIL/uL (ref 4.22–5.81)
RDW: 13.8 % (ref 11.5–15.5)
WBC: 8.9 10*3/uL (ref 4.0–10.5)

## 2012-05-13 LAB — PROTIME-INR
INR: 1.3 (ref 0.00–1.49)
Prothrombin Time: 15.9 seconds — ABNORMAL HIGH (ref 11.6–15.2)

## 2012-05-13 LAB — GLUCOSE, CAPILLARY
Glucose-Capillary: 121 mg/dL — ABNORMAL HIGH (ref 70–99)
Glucose-Capillary: 117 mg/dL — ABNORMAL HIGH (ref 70–99)
Glucose-Capillary: 180 mg/dL — ABNORMAL HIGH (ref 70–99)

## 2012-05-13 LAB — BASIC METABOLIC PANEL
Chloride: 105 mEq/L (ref 96–112)
GFR calc Af Amer: 88 mL/min — ABNORMAL LOW (ref >90)
GFR calc non Af Amer: 76 mL/min — ABNORMAL LOW (ref >90)
Potassium: 4 mEq/L (ref 3.5–5.1)
Sodium: 138 mEq/L (ref 135–145)

## 2012-05-13 MED ORDER — FREESTYLE LANCETS MISC: Status: AC

## 2012-05-13 MED ORDER — FREESTYLE SYSTEM KIT: 1.0000 | PACK | Status: AC | PRN

## 2012-05-13 MED ORDER — INSULIN GLARGINE 100 UNIT/ML ~~LOC~~ SOLN: 50.0000 [IU] | Freq: Every day | SUBCUTANEOUS | Status: AC

## 2012-05-13 MED ORDER — GLIMEPIRIDE 4 MG PO TABS: 4.0000 mg | ORAL_TABLET | Freq: Every day | ORAL | Status: DC

## 2012-05-13 MED ORDER — WARFARIN SODIUM 10 MG PO TABS
10.0000 mg | ORAL_TABLET | Freq: Once | ORAL | Status: DC
  Filled 2012-05-13: qty 1

## 2012-05-13 MED ORDER — GLUCOSE BLOOD VI STRP: ORAL_STRIP | Status: AC

## 2012-05-13 MED ORDER — ASPIRIN 81 MG PO CHEW: 81.0000 mg | CHEWABLE_TABLET | Freq: Every day | ORAL | Status: AC

## 2012-05-13 NOTE — Discharge Summary (Signed)
Physician Discharge Summary  Ad Guttman JXB:[id number] DOB: 11/04/1945 DOA: 05/08/2012  PCP: [name], MD  Admit date: 05/08/2012 Discharge date: 05/13/2012  Time spent: >30 minutes  Recommendations for Outpatient Follow-up:  Basic metabolic panel to follow kidney function and electrolytes Reevaluation patient blood sugar on further adjustment as needed Fibrillation the patient's blood pressure and adjustment of his medications for better control if required.  Discharge Diagnoses:  Principal Problem:   Mild DKA, type 2 Active Problems:   Atrial fibrillation with RVR   Tobacco abuse   CKD (chronic kidney disease) stage 2, GFR 60-89 ml/min   Chronic systolic CHF (congestive heart failure), NYHA class 3/ EF 20-25 %-up to 43% per recent Myoview   Hyponatremia   Leukocytosis   Lactic acid acidosis   Diabetes mellitus, new onset   Dehydration   Right ventricular dilation (moderate)   Discharge Condition: A stable and improved; advised to follow a low carbohydrate diet, to take medication as prescribed and to follow discharge instructions. He will follow with cardiology for further adjustment of his Coumadin and medications for his Non ischemic cardiomyopathy.  Diet recommendation:   Filed Weights   05/12/12 0015 05/12/12 1725 05/13/12 0536  Weight: 108.8 kg (239 lb 13.8 oz) 107.6 kg (237 lb 3.4 oz) 106 kg (233 lb 11 oz)    History of present illness:  67 year old male patient with chronic kidney disease and chronic systolic heart failure diagnosed initially in January 2013 which was felt to be tachycardia (nonischemic) related. He also has atrial fibrillation and is on chronic Coumadin anticoagulation. He presented to the urgent care on the date of admission with reports of weakness, dizziness, polydipsia and polyuria. He has felt poorly for least one month with decreased appetite and generalized malaise. He's also been experiencing occasional blurred vision upon standing. He  was found to be in atrial fibrillation with RVR. He received 2 L of normal saline in urgent care and transferred to [facility] emergency department. [facility] has been called to admit the patient for further evaluation and treatment.  Hospital Course:  Mild DKA, type II vs HONK / Diabetes mellitus, new onset  -AG closed in setting of normal pCO2 so likely was up 2/2 renally mediated acidosis from Dehydration and acute renal failure -Transitioned from insulin gtt to Lantus and SSI; also with meal coverage  -Will discharge patient on Lantus 50 units each bedtime and also Amaryl 4 mg in the morning before breakfast.  -Patient will receive  Glucometer, lancets, strips, glucose tablets as part of initial keep for patient's using insulin. -Advise to establish care with primary care physician for further medication adjustment of his diabetes. -HgbA1c 15.2 c/w mean plasma glucose of 390   Atrial fibrillation with RVR  -rate remains controlled  -BP normal now after IVF's  -resume lopressor  -Continue digoxin   Warfarin anticoagulation  -Cards ok to resume coumadin and they will follow patient after discharge interoffice. -chronic a. Fib; rate controlled (not need to be therapeutic or to overlap with anticoagulation therapy for this chronic condition).   Dehydration/ Hyponatremia / Leukocytosis  -positive orthostatic changes on admission and on 2/13  -normal BP today; no dizziness or any other complaints today.  -Advised to keep himself hydrated.  Acute on CKD (chronic kidney disease stage 2), GFR 60-89 ml/min  -Mild acute renal insuff. Due to dehydration and hypotension- improved and back to baseline after IVF's.  -Restarted on lisinopril.  Chronic systolic CHF (congestive heart failure), NYHA class 3/ EF  20-25 %-up to 43% per recent Myoview  -Cath 2/13 confirms non-ischemic CM  -currently compensated  -Continue BB, lisinopril, continue ASA and digoxin   Right ventricular dilation  (moderate)/ ? COPD/ tobacco abuse x45 years  -Likely has underlying COPD  -ECHO last month also c/w chronic RHF risk-pulmonic valve was poorly visualized but suspect has pulmonary HTN as well.  -Continue current treatment and follow with pulmonary service as an outpatient.   Hyperkalemia:  -resolved   Procedures: Cardiac catheterization (05/11/12)  Impression:  1. Mild non-obstructive CAD  2. Positive Non-ischemic cardiomyopathy   Consultations:  Cardiology  Discharge Exam: Filed Vitals:   05/12/12 2058 05/13/12 0536 05/13/12 0935 05/13/12 1408  BP: 113/84 125/89 138/86 129/69  Pulse: 111 67 88 81  Temp: 97.9 F (36.6 C) 97.9 F (36.6 C)  98.2 F (36.8 C)  TempSrc: Oral Oral  Oral  Resp: 18 18  20   Height:      Weight:  106 kg (233 lb 11 oz)    SpO2: 99% 98%  97%    General: NAD; denies CP or SOB. Cardiovascular: no murmurs, no gallops or rubs, no JVD; irregular rhythm  Respiratory: CTA bilaterally Abdomen: Nontender, nondistended, positive bowel sounds, soft and without any rebounds. Neurological: Alert, awake and oriented x3, no focal motor or sensory deficit; cranial nerve grossly intact.  Discharge Instructions  Discharge Orders   Future Orders Complete By Expires     Diet - low sodium heart healthy  As directed     Discharge instructions  As directed     Comments:      Establish care with PCP in 1-2 weeks -Take medications as instructed Follow with cardiology as indicated Follow a low sodium and low carbohydrates diet.        Medication List    TAKE these medications       aspirin 81 MG chewable tablet  Chew 1 tablet (81 mg total) by mouth daily.     digoxin 0.125 MG tablet  Commonly known as:  LANOXIN  Take 0.125 mg by mouth daily.     freestyle lancets  Use as instructed     furosemide 40 MG tablet  Commonly known as:  LASIX  Take 40 mg by mouth daily.     glimepiride 4 MG tablet  Commonly known as:  AMARYL  Take 1 tablet (4 mg total)  by mouth daily before breakfast.     glucose blood test strip  Use as instructed     glucose monitoring kit monitoring kit  1 each by Does not apply route as needed for other. Use to check Blood sugar Three times a day (fasting in the morning; around lung and before bedtime ; last one before lantus injection)     insulin glargine 100 UNIT/ML injection  Commonly known as:  LANTUS SOLOSTAR  Inject 50 Units into the skin at bedtime.     lisinopril 5 MG tablet  Commonly known as:  PRINIVIL,ZESTRIL  Take 1 tablet (5 mg total) by mouth daily.     metoprolol 100 MG tablet  Commonly known as:  LOPRESSOR  Take 1 tablet (100 mg total) by mouth 2 (two) times daily.     potassium chloride 10 MEQ tablet  Commonly known as:  K-DUR,KLOR-CON  Take 10 mEq by mouth daily.     warfarin 5 MG tablet  Commonly known as:  COUMADIN  Take 5 mg by mouth every evening.  The results of significant diagnostics from this hospitalization (including imaging, microbiology, ancillary and laboratory) are listed below for reference.    Significant Diagnostic Studies: Dg Chest Port 1 View  05/08/2012  *RADIOLOGY REPORT*  Clinical Data: Dizziness and cough.  PORTABLE CHEST - 1 VIEW  Comparison: One-view chest 04/25/2011.  Findings: Heart size is normal.  The lungs are clear.  The visualized soft tissues and bony thorax are unremarkable.  IMPRESSION: Negative chest.   Original Report Authenticated By: Marin Roberts, M.D.     Microbiology: Recent Results (from the past 240 hour(s))  MRSA PCR SCREENING     Status: None   Collection Time    05/08/12  4:49 PM      Result Value Range Status   MRSA by PCR NEGATIVE  NEGATIVE Final   Comment:            The GeneXpert MRSA Assay (FDA     approved for NASAL specimens     only), is one component of a     comprehensive MRSA colonization     surveillance program. It is not     intended to diagnose MRSA     infection nor to guide or     monitor  treatment for     MRSA infections.     Labs: Basic Metabolic Panel:  Recent Labs Lab 05/08/12 1329 05/09/12 0550 05/10/12 0420 05/11/12 0540 05/13/12 0500  NA 119* 133* 131* 135 138  K 5.1 3.5 3.8 3.6 4.0  CL 80* 96 101 102 105  CO2 24 26 22 21 26   GLUCOSE 838* 174* 260* 182* 134*  BUN 26* 20 15 10 6   CREATININE 1.38* 1.14 1.00 0.92 1.02  CALCIUM 9.7 8.9 8.0* 8.5 8.9   Liver Function Tests:  Recent Labs Lab 05/09/12 0550  AST 17  ALT 16  ALKPHOS 99  BILITOT 0.4  PROT 6.1  ALBUMIN 3.0*   CBC:  Recent Labs Lab 05/08/12 1249  05/09/12 0550 05/10/12 0420 05/11/12 0540 05/12/12 0204 05/13/12 0500  WBC 11.5*  --  9.8 7.7 7.4 7.6 8.9  NEUTROABS 9.0*  --   --   --   --   --   --   HGB 15.2  < > 14.3 12.8* 12.8* 13.2 13.2  HCT 42.8  < > 39.8 35.9* 35.9* 37.9* 37.7*  MCV 82.6  --  82.1 84.1 85.3 85.2 86.3  PLT 340  --  264 228 212 213 210  < > = values in this interval not displayed. Cardiac Enzymes:  Recent Labs Lab 05/08/12 1329 05/08/12 1631 05/08/12 2237 05/09/12 0550  TROPONINI <0.30 <0.30 <0.30 <0.30   CBG:  Recent Labs Lab 05/12/12 1324 05/12/12 1751 05/12/12 2128 05/13/12 0605 05/13/12 1101  GLUCAP 216* 88 121* 117* 180*    Signed:  Tamorah Hada  [facility] 05/13/2012, 2:18 PM

## 2012-05-13 NOTE — Progress Notes (Addendum)
Patient ID: Darryl [name], male   DOB: September 26, 1945, 67 y.o.   MRN: [id number]   SUBJECTIVE:  Patient is feeling good today. He hopes to go home.   Filed Vitals:   05/12/12 1725 05/12/12 2058 05/13/12 0536 05/13/12 0935  BP: 141/87 113/84 125/89 138/86  Pulse: 107 111 67 88  Temp: 97.8 F (36.6 C) 97.9 F (36.6 C) 97.9 F (36.6 C)   TempSrc: Oral Oral Oral   Resp: 21 18 18    Height: 6\' 2"  (1.88 m)     Weight: 237 lb 3.4 oz (107.6 kg)  233 lb 11 oz (106 kg)   SpO2: 99% 99% 98%     Intake/Output Summary (Last 24 hours) at 05/13/12 1026 Last data filed at 05/13/12 0806  Gross per 24 hour  Intake   1560 ml  Output   2300 ml  Net   -740 ml    LABS: Basic Metabolic Panel:  Recent Labs  86/57/84 0540 05/13/12 0500  NA 135 138  K 3.6 4.0  CL 102 105  CO2 21 26  GLUCOSE 182* 134*  BUN 10 6  CREATININE 0.92 1.02  CALCIUM 8.5 8.9   Liver Function Tests: No results found for this basename: AST, ALT, ALKPHOS, BILITOT, PROT, ALBUMIN,  in the last 72 hours No results found for this basename: LIPASE, AMYLASE,  in the last 72 hours CBC:  Recent Labs  05/12/12 0204 05/13/12 0500  WBC 7.6 8.9  HGB 13.2 13.2  HCT 37.9* 37.7*  MCV 85.2 86.3  PLT 213 210   Cardiac Enzymes: No results found for this basename: CKTOTAL, CKMB, CKMBINDEX, TROPONINI,  in the last 72 hours BNP: No components found with this basename: POCBNP,  D-Dimer: No results found for this basename: DDIMER,  in the last 72 hours Hemoglobin A1C: No results found for this basename: HGBA1C,  in the last 72 hours Fasting Lipid Panel: No results found for this basename: CHOL, HDL, LDLCALC, TRIG, CHOLHDL, LDLDIRECT,  in the last 72 hours Thyroid Function Tests: No results found for this basename: TSH, T4TOTAL, FREET3, T3FREE, THYROIDAB,  in the last 72 hours  RADIOLOGY: Dg Chest Port 1 View  05/08/2012  *RADIOLOGY REPORT*  Clinical Data: Dizziness and cough.  PORTABLE CHEST - 1 VIEW  Comparison: One-view  chest 04/25/2011.  Findings: Heart size is normal.  The lungs are clear.  The visualized soft tissues and bony thorax are unremarkable.  IMPRESSION: Negative chest.   Original Report Authenticated By: Marin Roberts, M.D.     PHYSICAL EXAM   Patient is oriented to person time and place. Affect is normal. Lungs reveal scattered rhonchi. Cardiac exam her vitals S1 and S2. Abdomen is soft. There is no significant peripheral edema.   TELEMETRY:  I have reviewed telemetry today May 13, 2012. At rest his rate is controlled. When he is up walking he does have increased heart rate.   ASSESSMENT AND PLAN:     Atrial fibrillation with RVR     He was switched back to beta blocker yesterday. He seems to have reasonable control at this time. We will have to assess whether or not he is still having increased heart rates when he is up and around. This will be done as an outpatient.      CKD (chronic kidney disease) stage 2, GFR 60-89 ml/min     Renal function is stable today.    Chronic systolic CHF (congestive heart failure), NYHA class 3/ EF 20-25 %-up to 43% per  recent Myoview        Volume status is controlled today    Coumadin therapy    The patient has been restarted on Coumadin. His INR is not yet therapeutic. I will check with Dr. Elease Hashimoto, but I think it will be okay for him to go home before he is totally re\re anticoagulated. If so we will discharge him home today. Willa Rough 05/13/2012 10:26 AM  11:03AM   I spoke with Dr. Elease Hashimoto. The patient's coumadinization can be completed as an outpatient. From the cardiac viewpoint the patient can be discharged home. Our team will make arrangements for the patient to be seen in our cardiology office within the next week.  Jerral Bonito, MD

## 2012-05-13 NOTE — Progress Notes (Addendum)
ANTICOAGULATION CONSULT NOTE - Follow Up Consult  Pharmacy Consult for heparin and warfarin Indication: atrial fibrillation  No Known Allergies  Patient Measurements: Height: 6\' 2"  (188 cm) Weight: 233 lb 11 oz (106 kg) (scale b) IBW/kg (Calculated) : 82.2 Heparin Dosing Weight: 104.5kg  Vital Signs: Temp: 97.9 F (36.6 C) (02/15 0536) Temp src: Oral (02/15 0536) BP: 138/86 mmHg (02/15 0935) Pulse Rate: 88 (02/15 0935)  Labs:  Recent Labs  05/11/12 0540 05/12/12 0204 05/12/12 0935 05/13/12 0500  HGB 12.8* 13.2  --  13.2  HCT 35.9* 37.9*  --  37.7*  PLT 212 213  --  210  LABPROT 16.3* 14.9  --  15.9*  INR 1.34 1.19  --  1.30  HEPARINUNFRC 0.63 0.41 0.45 0.43  CREATININE 0.92  --   --  1.02    Estimated Creatinine Clearance: 94.9 ml/min (by C-G formula based on Cr of 1.02).   Medications:  Heparin at 1700 units/hr   Assessment: 67 y.o. M on heparin bridge to therapeutic INR with warfarin for hx Afib. Heparin level this morning is therapeutic (HL 0.43, goal of 0.3-0.7). INR remains SUBtherapeutic however just resumed warfarin 2/13 PM (INR 1.3 << 1.19 << 1.34, goal of 2-3). Hgb/Hct/Plt stable. No s/sx of bleeding noted.  Noted patient may discharged to home today. If so, would need slight increase in dose (10mg  scheduled to be given tonight) while waiting for INR to get back into therapeutic range and then can transition back to home dose.  Goal of Therapy:  INR 2-3 Heparin level 0.3-0.7 units/ml Monitor platelets by anticoagulation protocol: Yes   Plan:  1. Continue heparin at 1700 units/hr (17 ml/hr)  2. Warfarin 10 mg x 1 dose at 1800 today  3. Will continue to monitor for any signs/symptoms of bleeding and will follow up with heparin level and PT/INR in the a.m.   Thank you for the consult.

## 2012-05-15 ENCOUNTER — Other Ambulatory Visit: Payer: Self-pay

## 2012-05-15 MED ORDER — WARFARIN SODIUM 5 MG PO TABS: ORAL_TABLET | ORAL | Status: DC

## 2012-05-16 ENCOUNTER — Ambulatory Visit: Payer: BC Managed Care – PPO | Admitting: Internal Medicine

## 2012-05-19 ENCOUNTER — Telehealth: Payer: Self-pay | Admitting: Cardiovascular Disease

## 2012-05-19 NOTE — Telephone Encounter (Signed)
New problem   Per after hour voice mail on  05/13/2012. Left message on home voice mail to call the office set up an appt.

## 2012-05-22 ENCOUNTER — Ambulatory Visit (INDEPENDENT_AMBULATORY_CARE_PROVIDER_SITE_OTHER): Payer: BC Managed Care – PPO | Admitting: *Deleted

## 2012-05-22 ENCOUNTER — Ambulatory Visit (INDEPENDENT_AMBULATORY_CARE_PROVIDER_SITE_OTHER): Payer: BC Managed Care – PPO | Admitting: Cardiovascular Disease

## 2012-05-22 ENCOUNTER — Encounter: Payer: Self-pay | Admitting: Cardiovascular Disease

## 2012-05-22 VITALS — BP 100/80 | HR 78 | Ht 74.0 in | Wt 233.4 lb

## 2012-05-22 DIAGNOSIS — I4891 Unspecified atrial fibrillation: Secondary | ICD-10-CM

## 2012-05-22 NOTE — Progress Notes (Signed)
Darryl [name] Date of Birth  Feb 19, 1946       [facility] Office [address]  [address], [address] [place], [place]  [address]   [place], [place]  [address] [phone]     [phone]   Fax  [phone]    Fax [phone]  Problem List: 1. Congestive heart failure-nonischemic cardiomyopathy 2. Atrial fibrillation-failed cardioversion in January, 2013. Also failed dofetilide therapy.  History of Present Illness: Darryl [name] is a 67 year old gentleman who is admitted to the hospital in January, 2013. He was admitted with congestive heart failure. His ejection fraction was around 30%. He was in atrial fibrillation. He failed cardioversion.  Also stopped his amiodarone because of his inability to maintain sinus rhythm.   He also tried dofetilide therapy but he failed to maintain sinus rhythm. Since that time he's done better. His ejection fraction is now up to 43% by Myoview study.  He still does not have quite as much energy as he would like. He's breathing much better than he was previous the. He does note that the diuretics seem to be working quite well.  He denies any PND or orthopnea.  Feb. 24, 2014:  Darryl [name] is feeling well following his hospitalization. He was hospitalized for congestive heart failure symptoms. He was found to have rapid atrial fibrillation. Heart catheterization revealed minimal coronary artery disease.   Echo Left ventricle: The cavity size was normal. Wall thickness was increased in a pattern of mild LVH. Systolic function was severely reduced. The estimated ejection fraction was in the range of 20% to 25%. Diffuse hypokinesis. Doppler parameters are consistent with high ventricular filling pressure. - Left atrium: The atrium was mildly dilated. - Right ventricle: The cavity size was moderately dilated. - Right atrium: The atrium was mildly dilated.  We have been gradually increasing his medications.  He feels quite well. He sleeps well. EKG today confirms the fact that he has converted to sinus rhythm at some point.   Current Outpatient Prescriptions on File Prior to Visit  Medication Sig Dispense Refill  . aspirin 81 MG chewable tablet Chew 1 tablet (81 mg total) by mouth daily.      . digoxin (LANOXIN) 0.125 MG tablet Take 0.125 mg by mouth daily.      . furosemide (LASIX) 40 MG tablet Take 40 mg by mouth daily.      Darryl [name] glimepiride (AMARYL) 4 MG tablet Take 1 tablet (4 mg total) by mouth daily before breakfast.  30 tablet  1  . glucose blood test strip Use as instructed  100 each  12  . glucose monitoring kit (FREESTYLE) monitoring kit 1 each by Does not apply route as needed for other. Use to check Blood sugar Three times a day (fasting in the morning; around lung and before bedtime ; last one before lantus injection)  1 each  0  . insulin glargine (LANTUS SOLOSTAR) 100 UNIT/ML injection Inject 50 Units into the skin at bedtime.  3 mL  3  . Lancets (FREESTYLE) lancets Use as instructed  100 each  12  . lisinopril (PRINIVIL,ZESTRIL) 5 MG tablet Take 1 tablet (5 mg total) by mouth daily.  30 tablet  6  . metoprolol (LOPRESSOR) 100 MG tablet Take 1 tablet (100 mg total) by mouth 2 (two) times daily.  60 tablet  5  . potassium chloride (K-DUR,KLOR-CON) 10 MEQ tablet Take 10 mEq by mouth daily.      Darryl [name]  warfarin (COUMADIN) 5 MG tablet Take as directed by anticoagulation clinic  30 tablet  0   No current facility-administered medications on file prior to visit.    No Known Allergies  Past Medical History  Diagnosis Date  . Sinusitis     a.  s/p sinus surgery in past  . Tobacco abuse     a.  45+ yrs 1/2-1ppd.  . Atrial fibrillation     a.  Dx 04/22/2011;  b. 04/26/11 failed ibutilide-facilitated DCCV (TEE w/o clot);  c. S/p DCCV 04/2011 but did not maintain NSR. d. amio stopped 3/13 due to not maintaining NSR.    Darryl [name] History of drug abuse     Smoked marijuana 1-2/ week up until about a year  ago. Snorted and/or smoke cocaine  approximately 1/month until 5 years ago  . 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 03/2011 echo EF 20-35%, Glob HK; myoview 04/2011 with ?attn versus scar, no significant ischemia, EF 43%.  . Acute  systolic heart failure     a. 0/9811  . Renal insufficiency   . Anticoagulated on Coumadin     a.  initiated 03/2011    Past Surgical History  Procedure Laterality Date  . Nasal septum surgery      30 YRS AGO  . Tee without cardioversion  04/26/2011    Procedure: TRANSESOPHAGEAL ECHOCARDIOGRAM (TEE);  Surgeon: [name], MD;  Location: [facility] ENDOSCOPY;  Service: Cardiovascular;  Laterality: N/A;  . Cardioversion  04/26/2011    Procedure: CARDIOVERSION;  Surgeon: [name], MD;  Location: [facility] ENDOSCOPY;  Service: Cardiovascular;  Laterality: N/A;  . Cardioversion  05/24/2011    Procedure: CARDIOVERSION;  Surgeon: [name], MD;  Location[facility] OR;  Service: Cardiovascular;  Laterality: N/A;    History  Smoking status  . Current Every Day Smoker -- 0.50 packs/day for 45 years  . Types: Cigarettes  Smokeless tobacco  . Not on file    History  Alcohol Use No    Family History  Problem Relation Age of Onset  . Lung cancer Mother     died from CA at age 90  . Lung cancer Father     died from CA at age 63  . Coronary artery disease Father 20    Pt reports his father had a "balloon placed through his arteries."  . Malignant hyperthermia Neg Hx     Reviw of Systems:  Reviewed in the HPI.  All other systems are negative.  Physical Exam: Blood pressure 100/80, pulse 78, height 6\' 2"  (1.88 m), weight 233 lb 6.4 oz (105.87 kg). General: Well developed, well nourished, in no acute distress.  Head: Normocephalic, atraumatic, sclera non-icteric, mucus membranes are moist,   Neck: Supple. Carotids are 2 + without bruits. No JVD  Lungs: Clear bilaterally to auscultation.  Heart: regular rate.  normal  S1 S2. No murmurs, gallops or  rubs.  Abdomen: Soft, non-tender, non-distended with normal bowel sounds. No hepatomegaly. No rebound/guarding. No masses.  Msk:  Strength and tone are normal  Extremities: No clubbing or cyanosis. No edema.  Distal pedal pulses are 2+ and equal bilaterally.  Neuro: Alert and oriented X 3. Moves all extremities spontaneously.  Psych:  Responds to questions appropriately with a normal affect.  ECG: 05/22/2012: No sinus rhythm at 76. He has a right superior axis deviation. As possible right ventricular hypertrophy. Has nonspecific ST-T wave abnormalities. Assessment / Plan:

## 2012-05-22 NOTE — Assessment & Plan Note (Signed)
Darryl [name] is doing very well. He is in sinus rhythm today. He has had chronic atrial fibrillation for quite some time but perhaps now converted back to sinus rhythm on higher doses of metoprolol. We'll continue his current medications. He's feeling quite well. His left ventricular systolic function continues to slowly improve.  I see him again in 6 months for followup office visit.

## 2012-05-22 NOTE — Patient Instructions (Addendum)
Your physician recommends that you continue on your current medications as directed. Please refer to the Current Medication list given to you today.  Your physician wants you to follow-up in: 6 MONTHS. You will receive a reminder letter in the mail two months in advance. If you don't receive a letter, please call our office to schedule the follow-up appointment.  

## 2012-05-23 ENCOUNTER — Encounter: Payer: Self-pay | Admitting: Family

## 2012-05-23 ENCOUNTER — Ambulatory Visit (INDEPENDENT_AMBULATORY_CARE_PROVIDER_SITE_OTHER): Payer: BC Managed Care – PPO | Admitting: Family

## 2012-05-23 VITALS — BP 110/74 | HR 70 | Temp 97.5°F | Resp 16 | Ht 74.0 in | Wt 234.1 lb

## 2012-05-23 DIAGNOSIS — J449 Chronic obstructive pulmonary disease, unspecified: Secondary | ICD-10-CM | POA: Insufficient documentation

## 2012-05-23 DIAGNOSIS — I4891 Unspecified atrial fibrillation: Secondary | ICD-10-CM

## 2012-05-23 DIAGNOSIS — Z720 Tobacco use: Secondary | ICD-10-CM

## 2012-05-23 NOTE — Progress Notes (Signed)
Subjective:    Patient ID: Darryl [name], male    DOB: January 28, 1946, 67 y.o.   MRN: [id number]  HPI  Darryl [name] is a 67 yr old male who presents today to establish care.  He has multiple medical problems and most recently was admitted to [facility] 2/10-2/15 with newly diagnosed DM2 with mild DKA. Records are reviewed. His hospitalization was preceded by a visit to the urgent care with weakness, dizziness, polydipsia and polyuria.  He was sent to [place] and was noted to be in AF with RVR.  Pt was hydrated and treated with insulin.  He was ultimately discharged to home on lantus 50 units daily and amaryl 4mg  in the morning before breakfast.  Pt's A1C in the hospital was 390. He reports that his fasting sugar this morning was 76. 76- 110 fasting. He denies any symptomatic hypoglycemia.    AF with RVR- pt is maintained on Lopressor, digoxin and coumadin. He has been on coumadin for 1 year.    CKD- Chart review shows Cr as high as 1.6 on admission but it had dropped to 1.0 after hydration.   Chronic Systolic CHF- had cath 2/13 which confirmed non-ischemic CM.  He is followed by Dr. Elease Hashimoto.   R ventricular dilation (moderate) pt has hx of tobacco abuse and hospitalist questioned underlying COPD and recommended follow up by pulmonary service as an outpatient.   Tobacco abuse- smokes 1/2 PPD.    Review of Systems  Constitutional:       Lost 30 pounds in the 3 months before his hospitalization for DM  HENT: Negative for hearing loss.   Eyes: Negative for visual disturbance.  Cardiovascular: Negative for leg swelling.  Gastrointestinal: Negative for diarrhea and constipation.  Endocrine: Negative for polydipsia and polyphagia.  Genitourinary: Negative for dysuria.  Musculoskeletal: Negative for arthralgias.  Skin: Negative for rash.  Neurological: Negative for headaches.  Hematological: Negative for adenopathy.  Psychiatric/Behavioral:       Denies depression/anxiety   Past Medical History   Diagnosis Date  . Sinusitis     a.  s/p sinus surgery in past  . Tobacco abuse     a.  45+ yrs 1/2-1ppd.  . Atrial fibrillation     a.  Dx 04/22/2011;  b. 04/26/11 failed ibutilide-facilitated DCCV (TEE w/o clot);  c. S/p DCCV 04/2011 but did not maintain NSR. d. amio stopped 3/13 due to not maintaining NSR.    [name] History of drug abuse     Smoked marijuana 1-2/ week up until about a year ago. Snorted and/or smoke cocaine  approximately 1/month until 5 years ago  . 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 03/2011 echo EF 20-35%, Glob HK; myoview 04/2011 with ?attn versus scar, no significant ischemia, EF 43%.  . Acute  systolic heart failure     a. 03/9145  . Renal insufficiency   . Anticoagulated on Coumadin     a.  initiated 03/2011  . Diabetes mellitus without complication     type II    History   Social History  . Marital Status: Married    Spouse Name: N/A    Number of Children: 2  . Years of Education: N/A   Occupational History  . 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 Smoking status: Current Every Day Smoker -- 0.50 packs/day for 45 years    Types: Cigarettes  . Smokeless tobacco: Not on file  Comment: 1/2 ppd  . Alcohol Use: No  . Drug Use: No     Comment: has used marijuana and cocaine in past  . Sexually Active: Not Currently   Other Topics Concern  . Not on file   Social History Narrative   2 grown sons- one in [place] and one in [place]   Legally separated   Lives alone   Retired Naval architect- shipping/receiving   Enjoys walking, bowling, pool    Past Surgical History  Procedure Laterality Date  . Nasal septum surgery      30 YRS AGO  . Tee without cardioversion  04/26/2011    Procedure: TRANSESOPHAGEAL ECHOCARDIOGRAM (TEE);  Surgeon: [name], MD;  Location: [facility] ENDOSCOPY;  Service: Cardiovascular;  Laterality: N/A;  . Cardioversion  04/26/2011    Procedure: CARDIOVERSION;  Surgeon: [name], MD;  Location: [facility] ENDOSCOPY;  Service: Cardiovascular;  Laterality: N/A;  . Cardioversion  05/24/2011    Procedure: CARDIOVERSION;  Surgeon: [name], MD;  Location: [facility] OR;  Service: Cardiovascular;  Laterality: N/A;    Family History  Problem Relation Age of Onset  . Lung cancer Mother     died from CA at age 39  . Lung cancer Father     died from CA at age 4  . Coronary artery disease Father 71    Pt reports his father had a "balloon placed through his arteries."  . Malignant hyperthermia Neg Hx     No Known Allergies  Current Outpatient Prescriptions on File Prior to Visit  Medication Sig Dispense Refill  . aspirin 81 MG chewable tablet Chew 1 tablet (81 mg total) by mouth daily.      . digoxin (LANOXIN) 0.125 MG tablet Take 0.125 mg by mouth daily.      . furosemide (LASIX) 40 MG tablet Take 40 mg by mouth daily.      [name] glimepiride (AMARYL) 4 MG tablet Take 1 tablet (4 mg total) by mouth daily before breakfast.  30 tablet  1  . glucose blood test strip Use as instructed  100 each  12  . glucose monitoring kit (FREESTYLE) monitoring kit 1 each by Does not apply route as needed for other. Use to check Blood sugar Three times a day (fasting in the morning; around lung and before bedtime ; last one before lantus injection)  1 each  0  . insulin glargine (LANTUS SOLOSTAR) 100 UNIT/ML injection Inject 50 Units into the skin at bedtime.  3 mL  3  . Lancets (FREESTYLE) lancets Use as instructed  100 each  12  . lisinopril (PRINIVIL,ZESTRIL) 5 MG tablet Take 1 tablet (5 mg total) by mouth daily.  30 tablet  6  . metoprolol (LOPRESSOR) 100 MG tablet Take 1 tablet (100 mg total) by mouth 2 (two) times daily.  60 tablet  5  . potassium chloride (K-DUR,KLOR-CON) 10 MEQ tablet Take 10 mEq by mouth daily.      [name] warfarin (COUMADIN) 5 MG tablet Take as directed by anticoagulation clinic  30 tablet  0   No current facility-administered medications on file prior to visit.    BP  110/74  Pulse 70  Temp(Src) 97.5 F (36.4 C) (Oral)  Resp 16  Ht 6\' 2"  (1.88 m)  Wt 234 lb 1.3 oz (106.178 kg)  BMI 30.04 kg/m2  SpO2 99%       Objective:   Physical Exam  Constitutional: He appears well-developed and well-nourished. No distress.  HENT:  Head: Normocephalic  and atraumatic.  Cardiovascular: Normal rate and regular rhythm.   No murmur heard. Pulmonary/Chest: Effort normal and breath sounds normal. No respiratory distress. He has no wheezes. He has no rales. He exhibits no tenderness.  Lymphadenopathy:    He has no cervical adenopathy.  Neurological: He is alert.  Skin: Skin is warm and dry.  Psychiatric: He has a normal mood and affect. His behavior is normal. Judgment and thought content normal.          Assessment & Plan:  30 minutes spent with pt today.  >50% of this time was spent counseling pt on diabetes and smoking cessation.

## 2012-05-23 NOTE — Assessment & Plan Note (Signed)
Per pt report, sugars are tightly controlled on lantus and amaryl. Continue same for now.  Refer for opthalmology.

## 2012-05-23 NOTE — Assessment & Plan Note (Signed)
Rate stable, on coumadin, managed by cardiology.

## 2012-05-23 NOTE — Assessment & Plan Note (Signed)
Cr prior to discharge from hospital was normal.  Monitor.

## 2012-05-23 NOTE — Assessment & Plan Note (Signed)
Pt appears compensated on current meds.  Management per cards.

## 2012-05-23 NOTE — Assessment & Plan Note (Signed)
Refer to pulmonology for further evaluation.  

## 2012-05-23 NOTE — Patient Instructions (Addendum)
Please follow up in 1 month for a fasting physical. Welcome to [facility]!

## 2012-05-23 NOTE — Assessment & Plan Note (Signed)
Counseled on importance of quitting.

## 2012-05-31 ENCOUNTER — Other Ambulatory Visit: Payer: Self-pay | Admitting: *Deleted

## 2012-05-31 MED ORDER — WARFARIN SODIUM 5 MG PO TABS: ORAL_TABLET | ORAL | Status: DC

## 2012-06-06 ENCOUNTER — Ambulatory Visit (INDEPENDENT_AMBULATORY_CARE_PROVIDER_SITE_OTHER): Payer: BC Managed Care – PPO | Admitting: Pulmonary Disease

## 2012-06-06 ENCOUNTER — Encounter: Payer: Self-pay | Admitting: Pulmonary Disease

## 2012-06-06 VITALS — BP 116/82 | HR 72 | Temp 97.5°F | Ht 74.0 in | Wt 236.0 lb

## 2012-06-06 NOTE — Progress Notes (Signed)
Subjective:    Patient ID: Darryl [name], male    DOB: 1946/03/18, 67 y.o.   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  He has chronic kidney disease and chronic systolic heart failure diagnosed initially in January 2013 which was felt to be tachycardia (nonischemic) related. He also has atrial fibrillation and is on chronic Coumadin anticoagulation. He was hospitalized from 2/10-2/15/14 for new onset DKA  He has NYHA class 3/ EF 20-25 %-up to 43% per recent Myoview , T. Confirmed EF of 25% with mild-to-moderate mitral regurgitation He has ongoing tobacco abuse x45 years , smokes one pack per day He can walk for a block and climbed one flight of stairs. He denies nocturnal wheezing, orthopnea or paroxysmal nocturnal dyspnea or pedal edema Spirometry surprisingly showed FEV1 of 77% with FEC of 84% and a normal ratio. Smaller airways were moderately decreased.      Past Medical History  Diagnosis Date  . Sinusitis     a.  s/p sinus surgery in past  . Tobacco abuse     a.  45+ yrs 1/2-1ppd.  . Atrial fibrillation     a.  Dx 04/22/2011;  b. 04/26/11 failed ibutilide-facilitated DCCV (TEE w/o clot);  c. S/p DCCV 04/2011 but did not maintain NSR. d. amio stopped 3/13 due to not maintaining NSR.    [name] History of drug abuse     Smoked marijuana 1-2/ week up until about a year ago. Snorted and/or smoke cocaine  approximately 1/month until 5 years ago  . 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 03/2011 echo EF 20-35%, Glob HK; myoview 04/2011 with ?attn versus scar, no significant ischemia, EF 43%.  . Acute  systolic heart failure     a. 03/3084  . Renal insufficiency   . Anticoagulated on Coumadin     a.  initiated 03/2011  . Diabetes mellitus without complication     type II    Past Surgical History  Procedure Laterality Date  . Nasal septum surgery      30 YRS AGO  .  Tee without cardioversion  04/26/2011    Procedure: TRANSESOPHAGEAL ECHOCARDIOGRAM (TEE);  Surgeon: [name], MD;  Location: [facility] ENDOSCOPY;  Service: Cardiovascular;  Laterality: N/A;  . Cardioversion  04/26/2011    Procedure: CARDIOVERSION;  Surgeon: [name], MD;  Location: [facility] ENDOSCOPY;  Service: Cardiovascular;  Laterality: N/A;  . Cardioversion  05/24/2011    Procedure: CARDIOVERSION;  Surgeon: [name], MD;  Location: [facility] OR;  Service: Cardiovascular;  Laterality: N/A;   No Known Allergies  History   Social History  . Marital Status: Married    Spouse Name: N/A    Number of Children: 2  . Years of Education: N/A   Occupational History  . 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 Smoking status: Current Every Day Smoker -- 0.50 packs/day for 45 years    Types: Cigarettes  . Smokeless tobacco: Not on file     Comment: 1/2 ppd  . Alcohol Use: No  . Drug Use: No     Comment: has used marijuana and cocaine in past  . Sexually Active: Not Currently   Other Topics Concern  . Not on file   Social History Narrative   2 grown sons- one in [place] and one in  [place]   Legally separated   Lives alone   Retired Naval architect- shipping/receiving   Enjoys walking, bowling, pool    Family History  Problem Relation Age of Onset  . Lung cancer Mother     died from CA at age 76  . Lung cancer Father     died from CA at age 43  . Coronary artery disease Father 68    Pt reports his father had a "balloon placed through his arteries."  . Malignant hyperthermia Neg Hx      Review of Systems  Constitutional: Negative for fever and unexpected weight change.  HENT: Positive for congestion, rhinorrhea and postnasal drip. Negative for ear pain, nosebleeds, sore throat, sneezing, trouble swallowing, dental problem and sinus pressure.   Eyes: Negative for redness and itching.  Respiratory: Positive for cough and shortness of breath.  Negative for chest tightness and wheezing.   Cardiovascular: Negative for palpitations and leg swelling.  Gastrointestinal: Negative for nausea and vomiting.  Genitourinary: Negative for dysuria.  Musculoskeletal: Negative for joint swelling.  Skin: Negative for rash.  Neurological: Negative for headaches.  Hematological: Does not bruise/bleed easily.  Psychiatric/Behavioral: Negative for dysphoric mood. The patient is not nervous/anxious.        Objective:   Physical Exam  Gen. Pleasant, well-nourished, in no distress, normal affect ENT - no lesions, no post nasal drip Neck: No JVD, no thyromegaly, no carotid bruits Lungs: no use of accessory muscles, no dullness to percussion, clear without rales or rhonchi  Cardiovascular: Rhythm regular, heart sounds  normal, no murmurs or gallops, no peripheral edema Abdomen: soft and non-tender, no hepatosplenomegaly, BS normal. Musculoskeletal: No deformities, no cyanosis or clubbing Neuro:  alert, non focal       Assessment & Plan:

## 2012-06-06 NOTE — Patient Instructions (Addendum)
You have to stop smoking Call us for a Rx of chantix if you are not able to quit smoking on your own We discussed screening CT scan for lung cancer

## 2012-06-09 NOTE — Assessment & Plan Note (Signed)
On echo 1/13 RV not visualised on TEE 2/14 Unclear how to proceed here, for the cardiologist with a repeat echo would help- review echo from 1/13

## 2012-06-09 NOTE — Assessment & Plan Note (Signed)
Surprisingly spirometry does not show significant airway obstruction Smoking cessation is the primary intervention here We discussed various aids for smoking cessation Call us for a Rx of chantix if you are not able to quit smoking on your own We discussed screening CT scan for lung cancer

## 2012-06-12 ENCOUNTER — Ambulatory Visit (INDEPENDENT_AMBULATORY_CARE_PROVIDER_SITE_OTHER): Payer: BC Managed Care – PPO | Admitting: *Deleted

## 2012-06-13 ENCOUNTER — Other Ambulatory Visit: Payer: Self-pay | Admitting: *Deleted

## 2012-06-13 MED ORDER — LISINOPRIL 5 MG PO TABS: 5.0000 mg | ORAL_TABLET | Freq: Every day | ORAL | Status: DC

## 2012-06-13 NOTE — Telephone Encounter (Signed)
Fax Received. Refill Completed. Darryl [name] (R.M.A)   

## 2012-06-20 ENCOUNTER — Encounter: Payer: Self-pay | Admitting: Family

## 2012-06-20 ENCOUNTER — Encounter: Payer: Self-pay | Admitting: Gastroenterology

## 2012-06-20 ENCOUNTER — Ambulatory Visit (INDEPENDENT_AMBULATORY_CARE_PROVIDER_SITE_OTHER): Payer: BC Managed Care – PPO | Admitting: Family

## 2012-06-20 VITALS — BP 98/70 | HR 70 | Temp 97.5°F | Resp 18 | Ht 74.0 in | Wt 241.1 lb

## 2012-06-20 DIAGNOSIS — R9389 Abnormal findings on diagnostic imaging of other specified body structures: Secondary | ICD-10-CM

## 2012-06-20 DIAGNOSIS — R195 Other fecal abnormalities: Secondary | ICD-10-CM | POA: Insufficient documentation

## 2012-06-20 DIAGNOSIS — Z720 Tobacco use: Secondary | ICD-10-CM

## 2012-06-20 DIAGNOSIS — R3989 Other symptoms and signs involving the genitourinary system: Secondary | ICD-10-CM

## 2012-06-20 DIAGNOSIS — Z0000 Encounter for general adult medical examination without abnormal findings: Secondary | ICD-10-CM

## 2012-06-20 DIAGNOSIS — Z23 Encounter for immunization: Secondary | ICD-10-CM

## 2012-06-20 DIAGNOSIS — F17200 Nicotine dependence, unspecified, uncomplicated: Secondary | ICD-10-CM

## 2012-06-20 LAB — LIPID PANEL: Triglycerides: 1170 mg/dL — ABNORMAL HIGH (ref <150)

## 2012-06-20 LAB — CBC WITH DIFFERENTIAL/PLATELET
Basophils Absolute: 0.1 10*3/uL (ref 0.0–0.1)
Basophils Relative: 1 % (ref 0–1)
Eosinophils Absolute: 0.2 10*3/uL (ref 0.0–0.7)
Eosinophils Relative: 2 % (ref 0–5)
HCT: 44.4 % (ref 39.0–52.0)
Lymphocytes Relative: 22 % (ref 12–46)
MCH: 29.5 pg (ref 26.0–34.0)
MCHC: 35.1 g/dL (ref 30.0–36.0)
MCV: 84.1 fL (ref 78.0–100.0)
Monocytes Absolute: 0.7 10*3/uL (ref 0.1–1.0)
RDW: 15.5 % (ref 11.5–15.5)

## 2012-06-20 LAB — HEPATIC FUNCTION PANEL
ALT: 12 U/L (ref 0–53)
Total Protein: 6.8 g/dL (ref 6.0–8.3)

## 2012-06-20 LAB — BASIC METABOLIC PANEL WITH GFR
BUN: 19 mg/dL (ref 6–23)
CO2: 22 mEq/L (ref 19–32)
Chloride: 101 mEq/L (ref 96–112)
Creat: 1.14 mg/dL (ref 0.50–1.35)
Glucose, Bld: 115 mg/dL — ABNORMAL HIGH (ref 70–99)

## 2012-06-20 MED ORDER — INSULIN PEN NEEDLE 32G X 4 MM MISC: Status: DC

## 2012-06-20 NOTE — Progress Notes (Signed)
Subjective:    Patient ID: Darryl [name], male    DOB: 05/04/1945, 67 y.o.   MRN: [id number]  HPI  Patient presents today for complete physical.  Immunizations: pneumovax up to date, due for tetanus and zostavax Diet: reports healthy diet. Stopped sodas, juices, has cut out juice as well Exercise: some walking.   Colonoscopy: due  Dexa: due PSA: due    Review of Systems  Constitutional: Negative for unexpected weight change.  HENT: Negative for hearing loss.   Eyes: Negative for visual disturbance.  Respiratory: Negative for shortness of breath.   Cardiovascular: Negative for chest pain.  Gastrointestinal: Negative for nausea, vomiting and diarrhea.  Genitourinary: Negative for dysuria and frequency.  Musculoskeletal: Negative for arthralgias.  Skin: Negative for rash.  Neurological: Negative for headaches.  Hematological: Negative for adenopathy.  Psychiatric/Behavioral:       Denies depression/anxiety   Past Medical History  Diagnosis Date  . Sinusitis     a.  s/p sinus surgery in past  . Tobacco abuse     a.  45+ yrs 1/2-1ppd.  . Atrial fibrillation     a.  Dx 04/22/2011;  b. 04/26/11 failed ibutilide-facilitated DCCV (TEE w/o clot);  c. S/p DCCV 04/2011 but did not maintain NSR. d. amio stopped 3/13 due to not maintaining NSR.    [name] History of drug abuse     Smoked marijuana 1-2/ week up until about a year ago. Snorted and/or smoke cocaine  approximately 1/month until 5 years ago  . 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 03/2011 echo EF 20-35%, Glob HK; myoview 04/2011 with ?attn versus scar, no significant ischemia, EF 43%.  . Acute  systolic heart failure     a. 06/979  . Renal insufficiency   . Anticoagulated on Coumadin     a.  initiated 03/2011  . Diabetes mellitus without complication     type II    History   Social History  . Marital Status: Married    Spouse Name: N/A    Number of Children: 2  . Years of Education: N/A   Occupational  History  . 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 Smoking status: Current Every Day Smoker -- 0.50 packs/day for 45 years    Types: Cigarettes  . Smokeless tobacco: Not on file     Comment: 1/2 ppd  . Alcohol Use: No  . Drug Use: No     Comment: has used marijuana and cocaine in past  . Sexually Active: Not Currently   Other Topics Concern  . Not on file   Social History Narrative   2 grown sons- one in [place] and one in [place]   Legally separated   Lives alone   Retired Naval architect- shipping/receiving   Enjoys walking, bowling, pool    Past Surgical History  Procedure Laterality Date  . Nasal septum surgery      30 YRS AGO  . Tee without cardioversion  04/26/2011    Procedure: TRANSESOPHAGEAL ECHOCARDIOGRAM (TEE);  Surgeon: [name], MD;  Location: [facility] ENDOSCOPY;  Service: Cardiovascular;  Laterality: N/A;  . Cardioversion  04/26/2011    Procedure: CARDIOVERSION;  Surgeon: [name], MD;  Location: [facility] ENDOSCOPY;  Service: Cardiovascular;  Laterality: N/A;  . Cardioversion  05/24/2011    Procedure: CARDIOVERSION;  Surgeon: [name], MD;  Location: [facility] OR;  Service: Cardiovascular;  Laterality: N/A;    Family History  Problem Relation Age of Onset  . Lung cancer Mother     died from CA at age 61  . Lung cancer Father     died from CA at age 50  . Coronary artery disease Father 23    Pt reports his father had a "balloon placed through his arteries."  . Malignant hyperthermia Neg Hx     No Known Allergies  Current Outpatient Prescriptions on File Prior to Visit  Medication Sig Dispense Refill  . aspirin 81 MG chewable tablet Chew 1 tablet (81 mg total) by mouth daily.      . digoxin (LANOXIN) 0.125 MG tablet Take 0.125 mg by mouth daily.      . furosemide (LASIX) 40 MG tablet Take 40 mg by mouth daily.      [name] glimepiride (AMARYL) 4 MG tablet Take 1 tablet (4 mg total) by mouth daily before breakfast.  30  tablet  1  . glucose blood test strip Use as instructed  100 each  12  . glucose monitoring kit (FREESTYLE) monitoring kit 1 each by Does not apply route as needed for other. Use to check Blood sugar Three times a day (fasting in the morning; around lung and before bedtime ; last one before lantus injection)  1 each  0  . insulin glargine (LANTUS SOLOSTAR) 100 UNIT/ML injection Inject 50 Units into the skin at bedtime.  3 mL  3  . Lancets (FREESTYLE) lancets Use as instructed  100 each  12  . lisinopril (PRINIVIL,ZESTRIL) 5 MG tablet Take 1 tablet (5 mg total) by mouth daily.  30 tablet  6  . metoprolol (LOPRESSOR) 100 MG tablet Take 1 tablet (100 mg total) by mouth 2 (two) times daily.  60 tablet  5  . potassium chloride (K-DUR,KLOR-CON) 10 MEQ tablet Take 10 mEq by mouth daily.      [name] warfarin (COUMADIN) 5 MG tablet Take as directed by anticoagulation clinic  35 tablet  3   No current facility-administered medications on file prior to visit.    BP 98/70  Pulse 70  Temp(Src) 97.5 F (36.4 C) (Oral)  Resp 18  Ht 6\' 2"  (1.88 m)  Wt 241 lb 1.3 oz (109.353 kg)  BMI 30.94 kg/m2  SpO2 99%       Objective:   Physical Exam Physical Exam  Constitutional: He is oriented to person, place, and time. He appears well-developed and well-nourished. No distress.  HENT:  Head: Normocephalic and atraumatic.  Right Ear: Tympanic membrane and ear canal normal.  Left Ear: Tympanic membrane and ear canal normal.  Mouth/Throat: Oropharynx is clear and moist.  Eyes: Pupils are equal, round, and reactive to light. No scleral icterus.  Neck: Normal range of motion. No thyromegaly present.  Cardiovascular: Normal rate and regular rhythm.   No murmur heard. Pulmonary/Chest: Effort normal and breath sounds normal. No respiratory distress. He has no wheezes. He has no rales. He exhibits no tenderness.  Abdominal: Soft. Bowel sounds are normal. He exhibits no distension and no mass. There is no tenderness.  There is no rebound and no guarding.  Musculoskeletal: He exhibits no edema.  Lymphadenopathy:    He has no cervical adenopathy.  Neurological: He is alert and oriented to person, place, and time.  He exhibits normal muscle tone. Coordination normal.  Skin: Skin is warm and dry.  Psychiatric: He has a normal mood and affect. His behavior is normal. Judgment and thought content normal.  GU: enlarged prostate noted.  ?  Nodule noted left lobe of prostate        Assessment & Plan:          Assessment & Plan:

## 2012-06-20 NOTE — Patient Instructions (Addendum)
Schedule your bone density at the front desk prior to leaving. Work hard on quitting smoking. Complete lab work prior to leaving. Call if you develop black or bloody stools. Check with your insurance re: coverage for shingles vaccine (Zostavax) and schedule nurse visit to receive in our office. You will be contacted about your referrals to Urology and Gastroenterology.  Please let us know if you have not heard back within 1 week about your referral. Schedule a follow up appointment in mid May (after 5/11).

## 2012-06-20 NOTE — Assessment & Plan Note (Signed)
Pt counseled on healthy diet, exercise.   Tdap today, obtain fasting labs including PSA. Refer for dexa. Needs colo. Will look into zostavax coverage and let us know if he wishes to proceed.

## 2012-06-20 NOTE — Addendum Note (Signed)
Addended by: Mervin Kung A on: 06/20/2012 09:00 AM   Modules accepted: Orders

## 2012-06-20 NOTE — Assessment & Plan Note (Signed)
We discussed importance of quitting smoking.   

## 2012-06-20 NOTE — Assessment & Plan Note (Signed)
Will refer to GI for colonoscopy.  Pt advised to call of he develops black/bloody stools.

## 2012-06-20 NOTE — Assessment & Plan Note (Signed)
Some asymmetry on exam.  Given family hx of prostate CA (dad had it) will obtain PSA and refer to urology for further evaluation.

## 2012-06-21 ENCOUNTER — Telehealth: Payer: Self-pay | Admitting: Family

## 2012-06-21 DIAGNOSIS — E785 Hyperlipidemia, unspecified: Secondary | ICD-10-CM | POA: Insufficient documentation

## 2012-06-21 LAB — URINALYSIS, ROUTINE W REFLEX MICROSCOPIC
Bilirubin Urine: NEGATIVE
Glucose, UA: NEGATIVE mg/dL
Hgb urine dipstick: NEGATIVE
Ketones, ur: NEGATIVE mg/dL
Leukocytes, UA: NEGATIVE
Protein, ur: NEGATIVE mg/dL
pH: 5.5 (ref 5.0–8.0)

## 2012-06-21 LAB — URINALYSIS, MICROSCOPIC ONLY
Bacteria, UA: NONE SEEN
Casts: NONE SEEN
Crystals: NONE SEEN

## 2012-06-21 MED ORDER — ROSUVASTATIN CALCIUM 20 MG PO TABS: 20.0000 mg | ORAL_TABLET | Freq: Every day | ORAL | Status: DC

## 2012-06-21 NOTE — Telephone Encounter (Signed)
Pls call pt and let him know that cholesterol is extremely high.  Also triglycerides very high.  He should work on low fat/low cholesterol diet, exercise and avoid concentrated sweets. Start crestor, follow up in 6 weeks for FLP/LFT dx (hyperlipidemia).

## 2012-06-22 NOTE — Telephone Encounter (Signed)
Notified pt and he voices understanding. States he will come fasting to his follow up on 08/06/12 and repeat labs at that time.

## 2012-07-03 ENCOUNTER — Ambulatory Visit (INDEPENDENT_AMBULATORY_CARE_PROVIDER_SITE_OTHER): Payer: BC Managed Care – PPO

## 2012-07-03 DIAGNOSIS — Z7901 Long term (current) use of anticoagulants: Secondary | ICD-10-CM

## 2012-07-03 DIAGNOSIS — I4891 Unspecified atrial fibrillation: Secondary | ICD-10-CM

## 2012-07-03 LAB — POCT INR: INR: 2.1

## 2012-07-06 ENCOUNTER — Encounter: Payer: Self-pay | Admitting: Gastroenterology

## 2012-07-06 ENCOUNTER — Ambulatory Visit (INDEPENDENT_AMBULATORY_CARE_PROVIDER_SITE_OTHER): Payer: BC Managed Care – PPO | Admitting: Gastroenterology

## 2012-07-06 ENCOUNTER — Ambulatory Visit (INDEPENDENT_AMBULATORY_CARE_PROVIDER_SITE_OTHER)
Admission: RE | Admit: 2012-07-06 | Discharge: 2012-07-06 | Disposition: A | Discharge status: Home / Self Care | Payer: BC Managed Care – PPO | Source: Ambulatory Visit | Attending: Family | Admitting: Family

## 2012-07-06 VITALS — HR 64 | Ht 74.0 in | Wt 239.4 lb

## 2012-07-06 DIAGNOSIS — I255 Ischemic cardiomyopathy: Secondary | ICD-10-CM

## 2012-07-06 DIAGNOSIS — J438 Other emphysema: Secondary | ICD-10-CM

## 2012-07-06 DIAGNOSIS — R195 Other fecal abnormalities: Secondary | ICD-10-CM

## 2012-07-06 DIAGNOSIS — E119 Type 2 diabetes mellitus without complications: Secondary | ICD-10-CM

## 2012-07-06 DIAGNOSIS — Z0000 Encounter for general adult medical examination without abnormal findings: Secondary | ICD-10-CM

## 2012-07-06 DIAGNOSIS — Z1211 Encounter for screening for malignant neoplasm of colon: Secondary | ICD-10-CM

## 2012-07-06 DIAGNOSIS — I2589 Other forms of chronic ischemic heart disease: Secondary | ICD-10-CM

## 2012-07-06 DIAGNOSIS — Z7901 Long term (current) use of anticoagulants: Secondary | ICD-10-CM

## 2012-07-06 DIAGNOSIS — I4891 Unspecified atrial fibrillation: Secondary | ICD-10-CM

## 2012-07-06 NOTE — Progress Notes (Signed)
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  He is referred for screening colonoscopy although he denies any GI complaints.  He apparently has some hemorrhoidal bleeding several months ago but has regular bowel movements at this time without melena or hematochezia.  Review of his labs shows normal CBC.  Family history is noncontributory.  He has never had colonoscopy or barium studies.  His shortness of breath with exertion, he sees cardiology, pulmonary, and his diabetes is managed by Dr. Sandford Craze.  To me, he denies upper GI or hepatobiliary complaints.  He follows a fairly regular diet.  In addition to Coumadin he is on aspirin.  His chart relates marijuana and chronic drug abuse.  I have reviewed this patient's present history, medical and surgical past history, allergies and medications.     ROS:   All systems were reviewed and are negative unless otherwise stated in the HPI.    Physical Exam: Unable to obtain a blood pressure today despite numerous attempts.  Pulse is 64 and regular and his weight is 239 pounds the BMI of 30.72. General well developed well nourished patient in no acute distress, appearing their stated age Eyes PERRLA, no icterus, fundoscopic exam per opthamologist Skin no lesions noted Neck supple, no adenopathy, no thyroid enlargement, no tenderness Chest markedly diminished breath sounds in both lung fields with scattered rhonchi. Heart no significant murmurs, gallops or rubs noted.. irregular rhythm without other abnormalities. Abdomen no hepatosplenomegaly masses or tenderness, BS normal.  Rectal inspection normal no fissures, or fistulae noted.   No masses or tenderness on digital exam. Stool guaiac negative. Extremities no acute joint lesions, edema, phlebitis or evidence of cellulitis. Neurologic patient oriented x 3, cranial nerves intact, no focal neurologic deficits noted. Psychological mental status normal and normal affect.  Assessment and plan: Class IV-ASA candidate for colonoscopy.  I do not think this procedure is indicated in this patient without further objective data to justify this high risk procedure in a patient who has marginal life expectancy with his multiple medical problems and lifestyle.  I have ordered CT colonoscopy exam and we will proceed accordingly.  If he has an invasive procedure,it would have to be done and in an in hospital setting.  I am somewhat concerned today about his lack of blood pressure, and referred him back to his primary care-cardiology doctors otherwise his continue medications as listed and reviewed. Encounter Diagnosis  Name Primary?  . Heme + stool Yes

## 2012-07-06 NOTE — Patient Instructions (Addendum)
  You will receive a call from [facility] in regards to your Virtual CT Colonoscopy.  Their phone number and address is below.  [facility] [address] location [phone]  ___________________________________________________________________________________________________________________________________________________________________________________________                                               We are excited to introduce MyChart, a new best-in-class service that provides you online access to important information in your electronic medical record. We want to make it easier for you to view your health information - all in one secure location - when and where you need it. We expect MyChart will enhance the quality of care and service we provide.  When you register for MyChart, you can:    View your test results.    Request appointments and receive appointment reminders via email.    Request medication renewals.    View your medical history, allergies, medications and immunizations.    Communicate with your physician's office through a password-protected site.    Conveniently print information such as your medication lists.  To find out if MyChart is right for you, please talk to a member of our clinical staff today. We will gladly answer your questions about this free health and wellness tool.  If you are age 38 or older and want a member of your family to have access to your record, you must provide written consent by completing a proxy form available at our office. Please speak to our clinical staff about guidelines regarding accounts for patients younger than age 83.  As you activate your MyChart account and need any technical assistance, please call the MyChart technical support line at [phone] 6287640266) or email your question to [email or web address]. If you email your question(s), please include your name, a return phone number  and the best time to reach you.  If you have non-urgent health-related questions, you can send a message to our office through MyChart at [email or web address]. If you have a medical emergency, call 911.  Thank you for using MyChart as your new health and wellness resource!   MyChart licensed from [employer],  4782-9562. Patents Pending.

## 2012-07-10 ENCOUNTER — Other Ambulatory Visit: Payer: Self-pay | Admitting: *Deleted

## 2012-07-10 NOTE — Telephone Encounter (Signed)
Opened in Error.

## 2012-07-11 ENCOUNTER — Telehealth: Payer: Self-pay | Admitting: Family

## 2012-07-11 MED ORDER — GLIMEPIRIDE 4 MG PO TABS
4.0000 mg | ORAL_TABLET | Freq: Every day | ORAL | Status: DC
Start: 2012-07-11 — End: 2012-10-11

## 2012-07-11 NOTE — Telephone Encounter (Signed)
Refill- amaryl 4mg  tab. Take one tablet by mouth once daily before breakfast. Qty 30 last fill 3.14.14

## 2012-07-18 ENCOUNTER — Ambulatory Visit
Admission: RE | Admit: 2012-07-18 | Discharge: 2012-07-18 | Disposition: A | Discharge status: Home / Self Care | Payer: BC Managed Care – PPO | Source: Ambulatory Visit | Attending: Gastroenterology | Admitting: Gastroenterology

## 2012-07-18 ENCOUNTER — Encounter: Payer: Self-pay | Admitting: Family

## 2012-07-18 ENCOUNTER — Telehealth: Payer: Self-pay | Admitting: Family

## 2012-07-18 DIAGNOSIS — R195 Other fecal abnormalities: Secondary | ICD-10-CM

## 2012-07-18 DIAGNOSIS — M8580 Other specified disorders of bone density and structure, unspecified site: Secondary | ICD-10-CM | POA: Insufficient documentation

## 2012-07-18 MED ORDER — ALENDRONATE SODIUM 70 MG PO TABS: ORAL_TABLET | ORAL | Status: DC

## 2012-07-18 NOTE — Telephone Encounter (Signed)
Pls call pt and let him know that his bone density shows thinning bones.  I would like him to add fosamax once a week, caltrate 600 + D bid and make sure that he gets regular exercise.  If he is still smoking he needs to stop.

## 2012-07-18 NOTE — Telephone Encounter (Signed)
Notified pt and he voices understanding. 

## 2012-07-21 ENCOUNTER — Telehealth: Payer: Self-pay | Admitting: Family

## 2012-07-21 DIAGNOSIS — N2889 Other specified disorders of kidney and ureter: Secondary | ICD-10-CM

## 2012-07-21 NOTE — Telephone Encounter (Signed)
Pls call pt and let him know that there is a spot on his left kidney- may be a cyst, but the radiologist could not tell for sure because it was a non contrast study.  I would like to order a CT with contrast to further evaluate.  Pended below. He will need to complete bmet at least 24 hours prior to CT.

## 2012-07-21 NOTE — Telephone Encounter (Signed)
Attempted to reach pt and received message that pt's voice mailbox has not been set up.

## 2012-07-24 NOTE — Telephone Encounter (Signed)
Notified pt and he voices understanding. He will come by the lab tomorrow for bmp.

## 2012-07-26 ENCOUNTER — Encounter: Payer: Self-pay | Admitting: Family

## 2012-07-26 LAB — BASIC METABOLIC PANEL
CO2: 25 mEq/L (ref 19–32)
Calcium: 10 mg/dL (ref 8.4–10.5)
Creat: 1.28 mg/dL (ref 0.50–1.35)
Glucose, Bld: 102 mg/dL — ABNORMAL HIGH (ref 70–99)

## 2012-07-27 ENCOUNTER — Encounter (HOSPITAL_BASED_OUTPATIENT_CLINIC_OR_DEPARTMENT_OTHER): Payer: Self-pay

## 2012-07-27 ENCOUNTER — Other Ambulatory Visit: Payer: BC Managed Care – PPO

## 2012-07-27 ENCOUNTER — Ambulatory Visit (HOSPITAL_BASED_OUTPATIENT_CLINIC_OR_DEPARTMENT_OTHER)
Admission: RE | Admit: 2012-07-27 | Discharge: 2012-07-27 | Disposition: A | Discharge status: Home / Self Care | Payer: BC Managed Care – PPO | Source: Ambulatory Visit | Attending: Family | Admitting: Family

## 2012-07-27 DIAGNOSIS — I2510 Atherosclerotic heart disease of native coronary artery without angina pectoris: Secondary | ICD-10-CM | POA: Insufficient documentation

## 2012-07-27 DIAGNOSIS — N289 Disorder of kidney and ureter, unspecified: Secondary | ICD-10-CM | POA: Insufficient documentation

## 2012-07-27 DIAGNOSIS — I700 Atherosclerosis of aorta: Secondary | ICD-10-CM | POA: Insufficient documentation

## 2012-07-27 DIAGNOSIS — N2889 Other specified disorders of kidney and ureter: Secondary | ICD-10-CM

## 2012-07-27 MED ORDER — IOHEXOL 350 MG/ML SOLN
100.0000 mL | Freq: Once | INTRAVENOUS | Status: AC | PRN
  Administered 2012-07-27: 100 mL via INTRAVENOUS

## 2012-07-31 ENCOUNTER — Ambulatory Visit (INDEPENDENT_AMBULATORY_CARE_PROVIDER_SITE_OTHER): Payer: BC Managed Care – PPO

## 2012-07-31 DIAGNOSIS — I4891 Unspecified atrial fibrillation: Secondary | ICD-10-CM

## 2012-07-31 DIAGNOSIS — Z7901 Long term (current) use of anticoagulants: Secondary | ICD-10-CM

## 2012-07-31 LAB — POCT INR: INR: 1.6

## 2012-08-11 ENCOUNTER — Telehealth: Payer: Self-pay | Admitting: *Deleted

## 2012-08-11 NOTE — Telephone Encounter (Signed)
I called [facility] to check on CT Colonoscopy Referral Per Rinaldo Cloud at [facility] patient is not scheduled yet. Waiting on patient.

## 2012-08-15 ENCOUNTER — Ambulatory Visit (INDEPENDENT_AMBULATORY_CARE_PROVIDER_SITE_OTHER): Payer: BC Managed Care – PPO | Admitting: Family

## 2012-08-15 ENCOUNTER — Encounter: Payer: Self-pay | Admitting: Family

## 2012-08-15 VITALS — BP 114/76 | HR 78 | Temp 98.6°F | Resp 16 | Ht 74.0 in | Wt 241.1 lb

## 2012-08-15 DIAGNOSIS — E119 Type 2 diabetes mellitus without complications: Secondary | ICD-10-CM

## 2012-08-15 DIAGNOSIS — F17200 Nicotine dependence, unspecified, uncomplicated: Secondary | ICD-10-CM

## 2012-08-15 DIAGNOSIS — E785 Hyperlipidemia, unspecified: Secondary | ICD-10-CM

## 2012-08-15 DIAGNOSIS — Z720 Tobacco use: Secondary | ICD-10-CM

## 2012-08-15 LAB — BASIC METABOLIC PANEL
Chloride: 98 mEq/L (ref 96–112)
Potassium: 5.1 mEq/L (ref 3.5–5.3)
Sodium: 136 mEq/L (ref 135–145)

## 2012-08-15 LAB — HEPATIC FUNCTION PANEL
ALT: 19 U/L (ref 0–53)
AST: 17 U/L (ref 0–37)
Alkaline Phosphatase: 90 U/L (ref 39–117)
Bilirubin, Direct: 0.1 mg/dL (ref 0.0–0.3)
Total Bilirubin: 0.4 mg/dL (ref 0.3–1.2)

## 2012-08-15 LAB — LIPID PANEL
HDL: 33 mg/dL — ABNORMAL LOW (ref >39)
Total CHOL/HDL Ratio: 7.5 Ratio
Triglycerides: 862 mg/dL — ABNORMAL HIGH (ref <150)

## 2012-08-15 NOTE — Progress Notes (Signed)
Subjective:    Patient ID: Darryl [name], male    DOB: Feb 20, 1946, 67 y.o.   MRN: [id number]  HPI  Mr. Darryl [name] is a 67 yr old male who presents today for follow up.  1) DM2- the pt is currently maintained on lantus (50 units HS) , amaryl.  Last A1C in February was 15.2! Reports sugars running 90-110. He denies symptomatic hypoglycemia.  He reports he is up to date on his eye examination.    2) Hyperlipidemia/hypertriglyceridemia- last visit Trigs were 1170 and Total cholesterol was 351.  The pt was started on crestor. Tolerating Crestor without myalgia.   3) Tobacco abuse- reports that he is down to 5 cigarettes a day.      Review of Systems    see HPI  Past Medical History  Diagnosis Date  . Sinusitis     a.  s/p sinus surgery in past  . Tobacco abuse     a.  45+ yrs 1/2-1ppd.  . Atrial fibrillation     a.  Dx 04/22/2011;  b. 04/26/11 failed ibutilide-facilitated DCCV (TEE w/o clot);  c. S/p DCCV 04/2011 but did not maintain NSR. d. amio stopped 3/13 due to not maintaining NSR.    [name] History of drug abuse     Smoked marijuana 1-2/ week up until about a year ago. Snorted and/or smoke cocaine  approximately 1/month until 5 years ago  . 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 03/2011 echo EF 20-35%, Glob HK; myoview 04/2011 with ?attn versus scar, no significant ischemia, EF 43%.  . Acute  systolic heart failure     a. 06/979  . Renal insufficiency   . Anticoagulated on Coumadin     a.  initiated 03/2011  . Diabetes mellitus without complication     type II    History   Social History  . Marital Status: Married    Spouse Name: N/A    Number of Children: 2  . Years of Education: N/A   Occupational History  . 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 Smoking status: Current Every Day Smoker -- 45 years    Types: Cigarettes  . Smokeless tobacco: Never Used     Comment: 5 cigarettes daily  . Alcohol Use: No  .  Drug Use: No     Comment: has used marijuana and cocaine in past  . Sexually Active: Not Currently   Other Topics Concern  . Not on file   Social History Narrative   2 grown sons- one in [place] and one in [place]   Legally separated   Lives alone   Retired Naval architect- shipping/receiving   Enjoys walking, bowling, pool    Past Surgical History  Procedure Laterality Date  . Nasal septum surgery      30 YRS AGO  . Tee without cardioversion  04/26/2011    Procedure: TRANSESOPHAGEAL ECHOCARDIOGRAM (TEE);  Surgeon: [name], MD;  Location: [facility] ENDOSCOPY;  Service: Cardiovascular;  Laterality: N/A;  . Cardioversion  04/26/2011    Procedure: CARDIOVERSION;  Surgeon: [name], MD;  Location: [facility] ENDOSCOPY;  Service: Cardiovascular;  Laterality: N/A;  . Cardioversion  05/24/2011    Procedure: CARDIOVERSION;  Surgeon: [name], MD;  Location: [facility] OR;  Service: Cardiovascular;  Laterality: N/A;    Family History  Problem Relation Age of Onset  . Lung cancer Mother     died from CA at age 9  .  Lung cancer Father     died from CA at age 40  . Coronary artery disease Father 25    Pt reports his father had a "balloon placed through his arteries."  . Malignant hyperthermia Neg Hx     No Known Allergies  Current Outpatient Prescriptions on File Prior to Visit  Medication Sig Dispense Refill  . alendronate (FOSAMAX) 70 MG tablet Take with a full glass of water on an empty stomach. Sit upright for 90 minutes after taking  4 tablet  5  . aspirin 81 MG chewable tablet Chew 1 tablet (81 mg total) by mouth daily.      . Calcium Carb-Cholecalciferol (CALTRATE 600+D) 600-800 MG-UNIT TABS Take 1 tablet by mouth 2 (two) times daily.      . digoxin (LANOXIN) 0.125 MG tablet Take 0.125 mg by mouth daily.      . furosemide (LASIX) 40 MG tablet Take 40 mg by mouth daily.      [name] glimepiride (AMARYL) 4 MG tablet Take 1 tablet (4 mg total) by mouth daily before breakfast.  30 tablet  2  .  glucose blood test strip Use as instructed  100 each  12  . glucose monitoring kit (FREESTYLE) monitoring kit 1 each by Does not apply route as needed for other. Use to check Blood sugar Three times a day (fasting in the morning; around lung and before bedtime ; last one before lantus injection)  1 each  0  . insulin glargine (LANTUS SOLOSTAR) 100 UNIT/ML injection Inject 50 Units into the skin at bedtime.  3 mL  3  . Insulin Pen Needle 32G X 4 MM MISC Use daily as directed.  100 each  1  . Lancets (FREESTYLE) lancets Use as instructed  100 each  12  . lisinopril (PRINIVIL,ZESTRIL) 5 MG tablet Take 1 tablet (5 mg total) by mouth daily.  30 tablet  6  . metoprolol (LOPRESSOR) 100 MG tablet Take 1 tablet (100 mg total) by mouth 2 (two) times daily.  60 tablet  5  . potassium chloride (K-DUR,KLOR-CON) 10 MEQ tablet Take 10 mEq by mouth daily.      . rosuvastatin (CRESTOR) 20 MG tablet Take 1 tablet (20 mg total) by mouth daily.  30 tablet  2  . warfarin (COUMADIN) 5 MG tablet Take as directed by anticoagulation clinic  35 tablet  3  . loratadine (CLARITIN) 10 MG tablet Take 10 mg by mouth daily.       No current facility-administered medications on file prior to visit.    BP 114/76  Pulse 78  Temp(Src) 98.6 F (37 C) (Oral)  Resp 16  Ht 6\' 2"  (1.88 m)  Wt 241 lb 1.9 oz (109.371 kg)  BMI 30.94 kg/m2  SpO2 98%    Objective:   Physical Exam  Constitutional: He is oriented to person, place, and time. He appears well-developed and well-nourished. No distress.  Cardiovascular: Normal rate and regular rhythm.   No murmur heard. Pulmonary/Chest: Effort normal and breath sounds normal. No respiratory distress. He has no wheezes. He has no rales. He exhibits no tenderness.  Musculoskeletal: He exhibits no edema.  Neurological: He is alert and oriented to person, place, and time.  Psychiatric: He has a normal mood and affect. His behavior is normal. Judgment and thought content normal.           Assessment & Plan:

## 2012-08-15 NOTE — Assessment & Plan Note (Signed)
He has cut down.  I commended him on this and encouraged him to work on quitting completely.

## 2012-08-15 NOTE — Patient Instructions (Addendum)
Please complete lab work prior to leaving. Follow up in 3 months.  

## 2012-08-15 NOTE — Assessment & Plan Note (Signed)
Obtain FLP/LFT, tolerating statin. Continue same.

## 2012-08-15 NOTE — Assessment & Plan Note (Signed)
Per report sugars are improved. Diabetic eye exam up to date.  Check A1C, urine microalbumin.  Continue lantus/amaryl.

## 2012-08-17 ENCOUNTER — Telehealth: Payer: Self-pay | Admitting: Family

## 2012-08-17 MED ORDER — OMEGA-3-ACID ETHYL ESTERS 1 G PO CAPS: 2.0000 g | ORAL_CAPSULE | Freq: Two times a day (BID) | ORAL | Status: DC

## 2012-08-17 MED ORDER — ROSUVASTATIN CALCIUM 20 MG PO TABS: 20.0000 mg | ORAL_TABLET | Freq: Every day | ORAL | Status: DC

## 2012-08-17 NOTE — Telephone Encounter (Signed)
Left message on voicemail to return call.

## 2012-08-17 NOTE — Telephone Encounter (Signed)
Notified pt and he voices understanding. 

## 2012-08-17 NOTE — Telephone Encounter (Signed)
Cholesterol improving, but still high.  Make sure he takes crestor every night an works on a low fat/low cholesterol diet and avoids concentrated sweets.  Triglycerides still high, add lovaza.

## 2012-08-19 ENCOUNTER — Telehealth: Payer: Self-pay | Admitting: Family

## 2012-08-19 NOTE — Telephone Encounter (Signed)
Calcium is elevated.  Please ask pt to return to lab for testing as below.

## 2012-08-22 NOTE — Telephone Encounter (Signed)
Notified pt and he voices understanding. Lab order signed.

## 2012-08-22 NOTE — Telephone Encounter (Signed)
Notified pt. He states he takes calcium 600mg  twice a day every day and wants to know if he should stop them? If so, will he still need to complete additional blood test? Please advise.

## 2012-08-22 NOTE — Telephone Encounter (Signed)
Stop calcium. Repeat blood work  Below in 2 weeks.

## 2012-08-25 ENCOUNTER — Ambulatory Visit (INDEPENDENT_AMBULATORY_CARE_PROVIDER_SITE_OTHER): Payer: BC Managed Care – PPO

## 2012-08-25 DIAGNOSIS — I4891 Unspecified atrial fibrillation: Secondary | ICD-10-CM

## 2012-08-25 DIAGNOSIS — Z7901 Long term (current) use of anticoagulants: Secondary | ICD-10-CM

## 2012-08-28 ENCOUNTER — Other Ambulatory Visit: Payer: Self-pay | Admitting: *Deleted

## 2012-08-28 MED ORDER — METOPROLOL TARTRATE 100 MG PO TABS: 100.0000 mg | ORAL_TABLET | Freq: Two times a day (BID) | ORAL | Status: DC

## 2012-08-28 NOTE — Telephone Encounter (Signed)
Fax Received. Refill Completed. Darryl [name] (R.M.A)   

## 2012-09-22 ENCOUNTER — Ambulatory Visit (INDEPENDENT_AMBULATORY_CARE_PROVIDER_SITE_OTHER): Payer: BC Managed Care – PPO | Admitting: *Deleted

## 2012-09-22 ENCOUNTER — Telehealth: Payer: Self-pay | Admitting: *Deleted

## 2012-09-22 DIAGNOSIS — I4891 Unspecified atrial fibrillation: Secondary | ICD-10-CM

## 2012-09-22 DIAGNOSIS — Z7901 Long term (current) use of anticoagulants: Secondary | ICD-10-CM

## 2012-09-25 ENCOUNTER — Other Ambulatory Visit: Payer: Self-pay | Admitting: *Deleted

## 2012-09-25 MED ORDER — FUROSEMIDE 40 MG PO TABS: 40.0000 mg | ORAL_TABLET | Freq: Every day | ORAL | Status: AC

## 2012-09-25 MED ORDER — POTASSIUM CHLORIDE CRYS ER 10 MEQ PO TBCR: 10.0000 meq | EXTENDED_RELEASE_TABLET | Freq: Every day | ORAL | Status: DC

## 2012-09-25 MED ORDER — DIGOXIN 125 MCG PO TABS: 0.1250 mg | ORAL_TABLET | Freq: Every day | ORAL | Status: AC

## 2012-09-25 NOTE — Telephone Encounter (Signed)
09/25/12 Error.

## 2012-09-25 NOTE — Telephone Encounter (Signed)
Fax Received. Refill Completed. Darryl [name] (R.M.A)   

## 2012-10-05 ENCOUNTER — Other Ambulatory Visit: Payer: Self-pay

## 2012-10-11 ENCOUNTER — Telehealth: Payer: Self-pay | Admitting: *Deleted

## 2012-10-11 MED ORDER — GLIMEPIRIDE 4 MG PO TABS: 4.0000 mg | ORAL_TABLET | Freq: Every day | ORAL | Status: DC

## 2012-10-11 NOTE — Telephone Encounter (Signed)
Received message from pt that Pharmacy has been faxing refill for glimepiride. No faxes received. Advised pt I was sending authorization electronically.

## 2012-10-20 ENCOUNTER — Ambulatory Visit (INDEPENDENT_AMBULATORY_CARE_PROVIDER_SITE_OTHER): Payer: BC Managed Care – PPO | Admitting: *Deleted

## 2012-10-20 DIAGNOSIS — I4891 Unspecified atrial fibrillation: Secondary | ICD-10-CM

## 2012-10-20 DIAGNOSIS — Z7901 Long term (current) use of anticoagulants: Secondary | ICD-10-CM

## 2012-10-20 LAB — POCT INR: INR: 1.8

## 2012-10-25 ENCOUNTER — Other Ambulatory Visit: Payer: Self-pay | Admitting: Pharmacist

## 2012-10-25 MED ORDER — WARFARIN SODIUM 5 MG PO TABS: ORAL_TABLET | ORAL | Status: DC

## 2012-11-10 ENCOUNTER — Ambulatory Visit (INDEPENDENT_AMBULATORY_CARE_PROVIDER_SITE_OTHER): Payer: BC Managed Care – PPO | Admitting: *Deleted

## 2012-11-10 DIAGNOSIS — I4891 Unspecified atrial fibrillation: Secondary | ICD-10-CM

## 2012-11-10 DIAGNOSIS — Z7901 Long term (current) use of anticoagulants: Secondary | ICD-10-CM

## 2012-11-10 LAB — POCT INR: INR: 1.6

## 2012-11-10 NOTE — Patient Instructions (Addendum)
8/20 have excision in 2 places, 1 on back, 1 on hand, at [facility] hospital in [place], [place] for 5 days, last dose 11/09/2012  When you start your coumadin back, take an extra 1/2 tablet for 2 days  Patient understand risks of not having INR checked 1 week post procedure, patient states he cant afford it, his appt made with Dr Namon Cirri appt 12/11/2012.

## 2012-11-13 ENCOUNTER — Telehealth: Payer: Self-pay | Admitting: *Deleted

## 2012-11-13 NOTE — Telephone Encounter (Signed)
Message copied by Carmela Hurt on Mon Nov 13, 2012 10:28 AM ------      Message from: Vesta Mixer      Created: Mon Nov 13, 2012  6:40 AM      Regarding: RE: clearance for a procedure on 8/20       Mr. Carlino will not need Lovenox bridging.                                ----- Message -----         From: Carmela Hurt, RN         Sent: 11/10/2012   7:38 AM           To: Vesta Mixer, MD, Carmela Hurt, RN      Subject: clearance for a procedure on 8/20                        Dr Elease Hashimoto      On 8/20 pt is  Having  excision in 2 places, 1 on back, 1 on hand, at [facility] hospital in [place], instructed to be off coumadin  for 5 days, last dose 11/09/2012. Will he need lovenox bridging?       ------

## 2012-11-15 ENCOUNTER — Encounter: Payer: Self-pay | Admitting: Family

## 2012-11-15 ENCOUNTER — Telehealth: Payer: Self-pay | Admitting: *Deleted

## 2012-11-15 ENCOUNTER — Ambulatory Visit (INDEPENDENT_AMBULATORY_CARE_PROVIDER_SITE_OTHER): Payer: BC Managed Care – PPO | Admitting: Family

## 2012-11-15 VITALS — BP 130/78 | HR 72 | Temp 97.5°F | Resp 16 | Wt 248.0 lb

## 2012-11-15 DIAGNOSIS — E119 Type 2 diabetes mellitus without complications: Secondary | ICD-10-CM

## 2012-11-15 DIAGNOSIS — E785 Hyperlipidemia, unspecified: Secondary | ICD-10-CM

## 2012-11-15 DIAGNOSIS — N182 Chronic kidney disease, stage 2 (mild): Secondary | ICD-10-CM

## 2012-11-15 DIAGNOSIS — I4891 Unspecified atrial fibrillation: Secondary | ICD-10-CM

## 2012-11-15 MED ORDER — FENOFIBRATE 145 MG PO TABS: 145.0000 mg | ORAL_TABLET | Freq: Every day | ORAL | Status: AC

## 2012-11-15 NOTE — Telephone Encounter (Signed)
eRx transmission to pharmacy failed and was called to [facility] voicemail as below:  fenofibrate (TRICOR) 145 MG tablet 30 tablet 5 11/15/2012 Sig - Route: Take 1 tablet (145 mg total) by mouth daily. - Oral E-Prescribing Status: Transmission to pharmacy failed (11/15/2012 8:20 AM EDT)

## 2012-11-15 NOTE — Assessment & Plan Note (Signed)
Rate controlled on dig.  Coumadin managed by cardiology.

## 2012-11-15 NOTE — Assessment & Plan Note (Signed)
Triglycerides are elevated.  Will continue crestor and add fenofibrate.

## 2012-11-15 NOTE — Assessment & Plan Note (Signed)
Creatinine stable at 1.2.  Monitor.

## 2012-11-15 NOTE — Assessment & Plan Note (Signed)
Well controlled on lantus and amaryl. Continue same.

## 2012-11-15 NOTE — Patient Instructions (Addendum)
Start fenofibrate. Follow up in 3 months for a follow up.  Come fasting to this appointment.

## 2012-11-15 NOTE — Progress Notes (Signed)
Subjective:    Patient ID: Darryl [name], male    DOB: January 25, 1946, 67 y.o.   MRN: [id number]  HPI  Darryl [name] is a 67 yr old male who presents today for follow up.  1) DM- currently maintained on amaryl, lantus, ACE. A1C drawn at the [place] was 6.3.  2) Hyperlipidemia-  He is currently on crestor 20mg .  He brings with him today his lab work from the [place] which reveals LDL 110, and triglycerides elevated at 832.  LFT's were normal.  He reports that he never started the lovaza.    3) CKD-  BUN/creatinine were normal on his most recent lab work.    4) AF-  He is currently maintained on digoxin and coumadin.  He is followed by the coumadin clinic.    He is having a skin lesion removed from his back and his right dorsal hand.    Review of Systems See HPI  Past Medical History  Diagnosis Date  . Sinusitis     a.  s/p sinus surgery in past  . Tobacco abuse     a.  45+ yrs 1/2-1ppd.  . Atrial fibrillation     a.  Dx 04/22/2011;  b. 04/26/11 failed ibutilide-facilitated DCCV (TEE w/o clot);  c. S/p DCCV 04/2011 but did not maintain NSR. d. amio stopped 3/13 due to not maintaining NSR.    [name] History of drug abuse     Smoked marijuana 1-2/ week up until about a year ago. Snorted and/or smoke cocaine  approximately 1/month until 5 years ago  . 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 03/2011 echo EF 20-35%, Glob HK; myoview 04/2011 with ?attn versus scar, no significant ischemia, EF 43%.  . Acute  systolic heart failure     a. 06/979  . Renal insufficiency   . Anticoagulated on Coumadin     a.  initiated 03/2011  . Diabetes mellitus without complication     type II    History   Social History  . Marital Status: Married    Spouse Name: N/A    Number of Children: 2  . Years of Education: N/A   Occupational History  . 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 Smoking status: Current Every Day Smoker -- 45 years    Types: Cigarettes   . Smokeless tobacco: Never Used     Comment: 5 cigarettes daily  . Alcohol Use: No  . Drug Use: No     Comment: has used marijuana and cocaine in past  . Sexual Activity: Not Currently   Other Topics Concern  . Not on file   Social History Narrative   2 grown sons- one in [place] and one in [place]   Legally separated   Lives alone   Retired Naval architect- shipping/receiving   Enjoys walking, bowling, pool    Past Surgical History  Procedure Laterality Date  . Nasal septum surgery      30 YRS AGO  . Tee without cardioversion  04/26/2011    Procedure: TRANSESOPHAGEAL ECHOCARDIOGRAM (TEE);  Surgeon: [name], MD;  Location: [facility] ENDOSCOPY;  Service: Cardiovascular;  Laterality: N/A;  . Cardioversion  04/26/2011    Procedure: CARDIOVERSION;  Surgeon: [name], MD;  Location: [facility] ENDOSCOPY;  Service: Cardiovascular;  Laterality: N/A;  . Cardioversion  05/24/2011    Procedure: CARDIOVERSION;  Surgeon: [name], MD;  Location: [facility] OR;  Service: Cardiovascular;  Laterality:  N/A;    Family History  Problem Relation Age of Onset  . Lung cancer Mother     died from CA at age 63  . Lung cancer Father     died from CA at age 5  . Coronary artery disease Father 74    Pt reports his father had a "balloon placed through his arteries."  . Malignant hyperthermia Neg Hx     No Known Allergies  Current Outpatient Prescriptions on File Prior to Visit  Medication Sig Dispense Refill  . alendronate (FOSAMAX) 70 MG tablet Take with a full glass of water on an empty stomach. Sit upright for 90 minutes after taking  4 tablet  5  . aspirin 81 MG chewable tablet Chew 1 tablet (81 mg total) by mouth daily.      . cetirizine (ZYRTEC) 10 MG tablet Take 10 mg by mouth daily.      . digoxin (LANOXIN) 0.125 MG tablet Take 1 tablet (0.125 mg total) by mouth daily.  30 tablet  6  . furosemide (LASIX) 40 MG tablet Take 1 tablet (40 mg total) by mouth daily.  30 tablet  6  . glimepiride (AMARYL) 4  MG tablet Take 1 tablet (4 mg total) by mouth daily before breakfast.  30 tablet  2  . glucose blood test strip Use as instructed  100 each  12  . glucose monitoring kit (FREESTYLE) monitoring kit 1 each by Does not apply route as needed for other. Use to check Blood sugar Three times a day (fasting in the morning; around lung and before bedtime ; last one before lantus injection)  1 each  0  . insulin glargine (LANTUS SOLOSTAR) 100 UNIT/ML injection Inject 50 Units into the skin at bedtime.  3 mL  3  . Insulin Pen Needle 32G X 4 MM MISC Use daily as directed.  100 each  1  . Lancets (FREESTYLE) lancets Use as instructed  100 each  12  . lisinopril (PRINIVIL,ZESTRIL) 5 MG tablet Take 1 tablet (5 mg total) by mouth daily.  30 tablet  6  . loratadine (CLARITIN) 10 MG tablet Take 10 mg by mouth daily.      . metoprolol (LOPRESSOR) 100 MG tablet Take 1 tablet (100 mg total) by mouth 2 (two) times daily.  60 tablet  5  . omega-3 acid ethyl esters (LOVAZA) 1 G capsule Take 2 capsules (2 g total) by mouth 2 (two) times daily.  120 capsule  5  . potassium chloride (K-DUR,KLOR-CON) 10 MEQ tablet Take 1 tablet (10 mEq total) by mouth daily.  30 tablet  6  . rosuvastatin (CRESTOR) 20 MG tablet Take 1 tablet (20 mg total) by mouth daily.  30 tablet  2  . warfarin (COUMADIN) 5 MG tablet Take as directed by anticoagulation clinic  35 tablet  3   No current facility-administered medications on file prior to visit.    BP 130/78  Pulse 72  Temp(Src) 97.5 F (36.4 C) (Oral)  Resp 16  Wt 248 lb (112.492 kg)  BMI 31.83 kg/m2       Objective:   Physical Exam  Constitutional: He is oriented to person, place, and time. He appears well-developed and well-nourished. No distress.  HENT:  Head: Normocephalic and atraumatic.  Cardiovascular: Normal rate and regular rhythm.   No murmur heard. Pulmonary/Chest: Effort normal and breath sounds normal. No respiratory distress. He has no wheezes. He has no rales.  He exhibits no tenderness.  Neurological:  He is alert and oriented to person, place, and time.  Psychiatric: He has a normal mood and affect. His behavior is normal. Judgment and thought content normal.          Assessment & Plan:

## 2012-11-21 ENCOUNTER — Encounter: Payer: Self-pay | Admitting: Family

## 2012-12-11 ENCOUNTER — Encounter: Payer: Self-pay | Admitting: Cardiovascular Disease

## 2012-12-11 ENCOUNTER — Ambulatory Visit (INDEPENDENT_AMBULATORY_CARE_PROVIDER_SITE_OTHER): Payer: BC Managed Care – PPO | Admitting: Cardiovascular Disease

## 2012-12-11 ENCOUNTER — Ambulatory Visit (INDEPENDENT_AMBULATORY_CARE_PROVIDER_SITE_OTHER): Payer: BC Managed Care – PPO | Admitting: Pharmacist

## 2012-12-11 VITALS — BP 96/67 | HR 58 | Ht 74.0 in | Wt 249.8 lb

## 2012-12-11 DIAGNOSIS — I5022 Chronic systolic (congestive) heart failure: Secondary | ICD-10-CM

## 2012-12-11 DIAGNOSIS — Z720 Tobacco use: Secondary | ICD-10-CM

## 2012-12-11 DIAGNOSIS — I4891 Unspecified atrial fibrillation: Secondary | ICD-10-CM

## 2012-12-11 DIAGNOSIS — Z7901 Long term (current) use of anticoagulants: Secondary | ICD-10-CM

## 2012-12-11 DIAGNOSIS — F17200 Nicotine dependence, unspecified, uncomplicated: Secondary | ICD-10-CM

## 2012-12-11 DIAGNOSIS — I509 Heart failure, unspecified: Secondary | ICD-10-CM

## 2012-12-11 LAB — POCT INR: INR: 2.4

## 2012-12-11 NOTE — Assessment & Plan Note (Signed)
Mr. Darryl [name] remains asymptomatic from an atrial fibrillation standpoint. We'll continue with his same medications. His rate is slow that she's on high dose of metoprolol

## 2012-12-11 NOTE — Patient Instructions (Addendum)
Your physician wants you to follow-up in: 6 months  You will receive a reminder letter in the mail two months in advance. If you don't receive a letter, please call our office to schedule the follow-up appointment.  Your physician recommends that you return for a FASTING lipid profile: 6 months   

## 2012-12-11 NOTE — Assessment & Plan Note (Signed)
His heart rate and blood pressure remained on the low side but he is completely asymptomatic. We'll continue with the current dose of metoprolol, Lasix, and lisinopril.  He will be establishing himself with the [facility]. He may try to get a cardiologist in the [place] system. If that is the case, we'll we will cancel her appointment. In the meanwhile, we'll schedule him for a 6 month office visit to make sure that he is seen by someone.

## 2012-12-11 NOTE — Progress Notes (Signed)
Agapito Games Date of Birth  [birth year]       [facility]    [place] [address]  [address] [place], [place]  [address]   [place], [place]  [address] [phone]     [phone]   Fax  [phone]    Fax [phone]  Problem List: 1. Congestive heart failure-nonischemic cardiomyopathy 2. Atrial fibrillation-failed cardioversion in January, 2013. Also failed dofetilide therapy.  History of Present Illness: Mr. Sylla is a 67 year old gentleman who is admitted to the hospital in January, 2013. He was admitted with congestive heart failure. His ejection fraction was around 30%. He was in atrial fibrillation. He failed cardioversion.  Also stopped his amiodarone because of his inability to maintain sinus rhythm.   He also tried dofetilide therapy but he failed to maintain sinus rhythm. Since that time he's done better. His ejection fraction is now up to 43% by Myoview study.  He still does not have quite as much energy as he would like. He's breathing much better than he was previous the. He does note that the diuretics seem to be working quite well.  He denies any PND or orthopnea.  Feb. 24, 2014:  Mr. Terlizzi is feeling well following his hospitalization. He was hospitalized for congestive heart failure symptoms. He was found to have rapid atrial fibrillation. Heart catheterization revealed minimal coronary artery disease.   Echo Left ventricle: The cavity size was normal. Wall thickness was increased in a pattern of mild LVH. Systolic function was severely reduced. The estimated ejection fraction was in the range of 20% to 25%. Diffuse hypokinesis. Doppler parameters are consistent with high ventricular filling pressure. - Left atrium: The atrium was mildly dilated. - Right ventricle: The cavity size was moderately dilated. - Right atrium: The atrium was mildly dilated.  We have been gradually increasing his medications.  He feels quite well. He sleeps well. EKG today confirms the fact that he has converted to sinus rhythm at some point.  Sept. 15, 2014:  He is doing about the same.   His dyspnea is about the same.    He continues to smoke.   He says that he has cut back.  Wants to quit.   He walks every day.     Current Outpatient Prescriptions on File Prior to Visit  Medication Sig Dispense Refill  . alendronate (FOSAMAX) 70 MG tablet Take with a full glass of water on an empty stomach. Sit upright for 90 minutes after taking  4 tablet  5  . aspirin 81 MG chewable tablet Chew 1 tablet (81 mg total) by mouth daily.      . cetirizine (ZYRTEC) 10 MG tablet Take 10 mg by mouth daily.      . digoxin (LANOXIN) 0.125 MG tablet Take 1 tablet (0.125 mg total) by mouth daily.  30 tablet  6  . fenofibrate (TRICOR) 145 MG tablet Take 1 tablet (145 mg total) by mouth daily.  30 tablet  5  . furosemide (LASIX) 40 MG tablet Take 1 tablet (40 mg total) by mouth daily.  30 tablet  6  . glimepiride (AMARYL) 4 MG tablet Take 1 tablet (4 mg total) by mouth daily before breakfast.  30 tablet  2  . glucose blood test strip Use as instructed  100 each  12  . glucose monitoring kit (FREESTYLE) monitoring kit 1 each by Does not apply route as needed for other. Use to check Blood  sugar Three times a day (fasting in the morning; around lung and before bedtime ; last one before lantus injection)  1 each  0  . insulin glargine (LANTUS SOLOSTAR) 100 UNIT/ML injection Inject 50 Units into the skin at bedtime.  3 mL  3  . Insulin Pen Needle 32G X 4 MM MISC Use daily as directed.  100 each  1  . Lancets (FREESTYLE) lancets Use as instructed  100 each  12  . lisinopril (PRINIVIL,ZESTRIL) 5 MG tablet Take 1 tablet (5 mg total) by mouth daily.  30 tablet  6  . loratadine (CLARITIN) 10 MG tablet Take 10 mg by mouth daily.      . metoprolol (LOPRESSOR) 100 MG tablet Take 1 tablet (100 mg total) by mouth 2 (two) times daily.  60 tablet  5  .  potassium chloride (K-DUR,KLOR-CON) 10 MEQ tablet Take 1 tablet (10 mEq total) by mouth daily.  30 tablet  6  . rosuvastatin (CRESTOR) 20 MG tablet Take 1 tablet (20 mg total) by mouth daily.  30 tablet  2  . warfarin (COUMADIN) 5 MG tablet Take as directed by anticoagulation clinic  35 tablet  3   No current facility-administered medications on file prior to visit.    No Known Allergies  Past Medical History  Diagnosis Date  . Sinusitis     a.  s/p sinus surgery in past  . Tobacco abuse     a.  45+ yrs 1/2-1ppd.  . Atrial fibrillation     a.  Dx 04/22/2011;  b. 04/26/11 failed ibutilide-facilitated DCCV (TEE w/o clot);  c. S/p DCCV 04/2011 but did not maintain NSR. d. amio stopped 3/13 due to not maintaining NSR.    [name] History of drug abuse     Smoked marijuana 1-2/ week up until about a year ago. Snorted and/or smoke cocaine  approximately 1/month until 5 years ago  . 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 03/2011 echo EF 20-35%, Glob HK; myoview 04/2011 with ?attn versus scar, no significant ischemia, EF 43%.  . Acute  systolic heart failure     a. 03/6107  . Renal insufficiency   . Anticoagulated on Coumadin     a.  initiated 03/2011  . Diabetes mellitus without complication     type II    Past Surgical History  Procedure Laterality Date  . Nasal septum surgery      30 YRS AGO  . Tee without cardioversion  04/26/2011    Procedure: TRANSESOPHAGEAL ECHOCARDIOGRAM (TEE);  Surgeon: [name], MD;  Location: [facility] ENDOSCOPY;  Service: Cardiovascular;  Laterality: N/A;  . Cardioversion  04/26/2011    Procedure: CARDIOVERSION;  Surgeon: [name], MD;  Location: [facility] ENDOSCOPY;  Service: Cardiovascular;  Laterality: N/A;  . Cardioversion  05/24/2011    Procedure: CARDIOVERSION;  Surgeon: [name], MD;  Location: [facility] OR;  Service: Cardiovascular;  Laterality: N/A;    History  Smoking status  . Current Every Day Smoker -- 45 years  . Types: Cigarettes  Smokeless tobacco   . Never Used    Comment: 5 cigarettes daily    History  Alcohol Use No    Family History  Problem Relation Age of Onset  . Lung cancer Mother     died from CA at age 67  . Lung cancer Father     died from CA at age 30  . Coronary artery disease Father 31    Pt reports his father had a "balloon  placed through his arteries."  . Malignant hyperthermia Neg Hx     Reviw of Systems:  Reviewed in the HPI.  All other systems are negative.  Physical Exam: Blood pressure 96/67, pulse 58, height 6\' 2"  (1.88 m), weight 249 lb 12.8 oz (113.309 kg). General: Well developed, well nourished, in no acute distress.  Head: Normocephalic, atraumatic, sclera non-icteric, mucus membranes are moist,   Neck: Supple. Carotids are 2 + without bruits. No JVD  Lungs: Clear bilaterally to auscultation.  Heart: irregularly irregular.   normal  S1 S2. No murmurs, gallops or rubs.  Abdomen: Soft, non-tender, non-distended with normal bowel sounds. No hepatomegaly. No rebound/guarding. No masses.  Msk:  Strength and tone are normal  Extremities: No clubbing or cyanosis. No edema.  Distal pedal pulses are 2+ and equal bilaterally.  Neuro: Alert and oriented X 3. Moves all extremities spontaneously.  Psych:  Responds to questions appropriately with a normal affect.  ECG:  Assessment / Plan:

## 2012-12-11 NOTE — Assessment & Plan Note (Signed)
Have encouraged him to stop smoking.

## 2013-01-04 ENCOUNTER — Other Ambulatory Visit: Payer: Self-pay | Admitting: Family

## 2013-01-04 NOTE — Telephone Encounter (Signed)
Rx request to pharmacy/SLS  

## 2013-01-05 ENCOUNTER — Other Ambulatory Visit: Payer: Self-pay | Admitting: *Deleted

## 2013-01-05 MED ORDER — WARFARIN SODIUM 5 MG PO TABS: ORAL_TABLET | ORAL | Status: AC

## 2013-01-08 ENCOUNTER — Ambulatory Visit (INDEPENDENT_AMBULATORY_CARE_PROVIDER_SITE_OTHER): Payer: BC Managed Care – PPO | Admitting: *Deleted

## 2013-01-08 ENCOUNTER — Other Ambulatory Visit: Payer: Self-pay | Admitting: Family

## 2013-01-08 DIAGNOSIS — Z7901 Long term (current) use of anticoagulants: Secondary | ICD-10-CM

## 2013-01-08 DIAGNOSIS — I4891 Unspecified atrial fibrillation: Secondary | ICD-10-CM

## 2013-01-08 LAB — POCT INR: INR: 3.4

## 2013-01-08 NOTE — Telephone Encounter (Signed)
Rx request to pharmacy/SLS  

## 2013-01-13 ENCOUNTER — Other Ambulatory Visit: Payer: Self-pay | Admitting: Family

## 2013-01-16 ENCOUNTER — Other Ambulatory Visit: Payer: Self-pay

## 2013-01-16 MED ORDER — LISINOPRIL 5 MG PO TABS: 5.0000 mg | ORAL_TABLET | Freq: Every day | ORAL | Status: DC

## 2013-01-18 ENCOUNTER — Telehealth: Payer: Self-pay | Admitting: Cardiovascular Disease

## 2013-01-18 NOTE — Telephone Encounter (Signed)
Pt informed to hold coumadin 5 days/ pt will be on abx for three days while off coumadin, told him to make sooner f/u and  pt verbalized understanding.

## 2013-01-18 NOTE — Telephone Encounter (Signed)
Will await call back from pt.

## 2013-01-18 NOTE — Telephone Encounter (Signed)
Pt may hold coumadin for 5 days prior to prostate bx

## 2013-01-18 NOTE — Telephone Encounter (Signed)
New Problem       Pt is having a biopsy on his prostate and has to stop his coumadin for 5 days.     Thanks.

## 2013-01-26 ENCOUNTER — Other Ambulatory Visit: Payer: Self-pay | Admitting: Family

## 2013-02-08 ENCOUNTER — Ambulatory Visit (INDEPENDENT_AMBULATORY_CARE_PROVIDER_SITE_OTHER): Payer: BC Managed Care – PPO | Admitting: *Deleted

## 2013-02-08 DIAGNOSIS — Z7901 Long term (current) use of anticoagulants: Secondary | ICD-10-CM

## 2013-02-08 DIAGNOSIS — I4891 Unspecified atrial fibrillation: Secondary | ICD-10-CM

## 2013-02-08 LAB — POCT INR: INR: 2.3

## 2013-02-12 ENCOUNTER — Ambulatory Visit (INDEPENDENT_AMBULATORY_CARE_PROVIDER_SITE_OTHER): Payer: BC Managed Care – PPO | Admitting: Family

## 2013-02-12 ENCOUNTER — Encounter: Payer: Self-pay | Admitting: Family

## 2013-02-12 VITALS — BP 100/70 | HR 85 | Temp 97.7°F | Resp 16 | Ht 74.0 in | Wt 258.0 lb

## 2013-02-12 DIAGNOSIS — E785 Hyperlipidemia, unspecified: Secondary | ICD-10-CM

## 2013-02-12 DIAGNOSIS — Z720 Tobacco use: Secondary | ICD-10-CM

## 2013-02-12 DIAGNOSIS — F17200 Nicotine dependence, unspecified, uncomplicated: Secondary | ICD-10-CM

## 2013-02-12 DIAGNOSIS — E119 Type 2 diabetes mellitus without complications: Secondary | ICD-10-CM

## 2013-02-12 LAB — BASIC METABOLIC PANEL WITH GFR
BUN: 18 mg/dL (ref 6–23)
Chloride: 100 mEq/L (ref 96–112)
GFR, Est African American: 58 mL/min — ABNORMAL LOW
GFR, Est Non African American: 50 mL/min — ABNORMAL LOW
Glucose, Bld: 104 mg/dL — ABNORMAL HIGH (ref 70–99)
Potassium: 5.4 mEq/L — ABNORMAL HIGH (ref 3.5–5.3)
Sodium: 135 mEq/L (ref 135–145)

## 2013-02-12 LAB — HEPATIC FUNCTION PANEL
AST: 18 U/L (ref 0–37)
Albumin: 4.2 g/dL (ref 3.5–5.2)
Alkaline Phosphatase: 49 U/L (ref 39–117)
Indirect Bilirubin: 0.3 mg/dL (ref 0.0–0.9)
Total Bilirubin: 0.4 mg/dL (ref 0.3–1.2)
Total Protein: 7.1 g/dL (ref 6.0–8.3)

## 2013-02-12 LAB — LIPID PANEL
Cholesterol: 209 mg/dL — ABNORMAL HIGH (ref 0–200)
HDL: 27 mg/dL — ABNORMAL LOW (ref >39)
Total CHOL/HDL Ratio: 7.7 Ratio
Triglycerides: 537 mg/dL — ABNORMAL HIGH (ref <150)

## 2013-02-12 LAB — HEMOGLOBIN A1C: Hgb A1c MFr Bld: 6.3 % — ABNORMAL HIGH (ref <5.7)

## 2013-02-12 NOTE — Assessment & Plan Note (Signed)
Stable.  Obtain HgA1C and BMET today, continue to take Lantus and Amaryl.

## 2013-02-12 NOTE — Assessment & Plan Note (Signed)
Denies complications. Obtain BMET today.  Follow up in 3 months.

## 2013-02-12 NOTE — Patient Instructions (Signed)
Please complete lab work prior to leaving. Follow up in 3 months.  

## 2013-02-12 NOTE — Assessment & Plan Note (Signed)
Educated and encouraged patient on importance of smoking cessation. Will continue to assess cessation readiness.

## 2013-02-12 NOTE — Assessment & Plan Note (Signed)
Patient taking Tricor and Crestor due to hypertriglyceridemia.  Obtain lipid panel and liver function labs today.  Continue Tricor and Crestor.

## 2013-02-12 NOTE — Progress Notes (Signed)
Subjective:    Patient ID: Darryl [name], male    DOB: 01-Mar-1946, 67 y.o.   MRN: [id number]  HPI 1)Diabetes: Patient reports morning BS 90, evening BS 150, denies numbness/tingling or wounds to feet  2)Hyperlipidemia: Hypertriglyceridemia takes Tricor and Crestor, reports diet is fair and is working to decrease fried fatty foods.  3)Chronic Kidney Disease: Patient denies dysuria, difficulty urinating.  Lab Results  Component Value Date   CREATININE 1.16 08/15/2012    4) Smoking cessation: patient smokes 5 to 10 cigarettes per day, reports he's trying to stop smoking, declines medication to aid in smoking cessation.    Review of Systems  Constitutional: Negative for activity change and fatigue.  HENT: Negative for rhinorrhea.   Respiratory: Negative for cough and shortness of breath.   Cardiovascular: Negative for chest pain.  Gastrointestinal: Negative for abdominal pain.  Genitourinary: Negative for dysuria.  Neurological: Negative for dizziness, numbness and headaches.   Past Medical History  Diagnosis Date  . Sinusitis     a.  s/p sinus surgery in past  . Tobacco abuse     a.  45+ yrs 1/2-1ppd.  . Atrial fibrillation     a.  Dx 04/22/2011;  b. 04/26/11 failed ibutilide-facilitated DCCV (TEE w/o clot);  c. S/p DCCV 04/2011 but did not maintain NSR. d. amio stopped 3/13 due to not maintaining NSR.    [name] History of drug abuse     Smoked marijuana 1-2/ week up until about a year ago. Snorted and/or smoke cocaine  approximately 1/month until 5 years ago  . 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 03/2011 echo EF 20-35%, Glob HK; myoview 04/2011 with ?attn versus scar, no significant ischemia, EF 43%.  . Acute  systolic heart failure     a. 06/979  . Renal insufficiency   . Anticoagulated on Coumadin     a.  initiated 03/2011  . Diabetes mellitus without complication     type II    History   Social History  . Marital Status: Married    Spouse Name: N/A    Number  of Children: 2  . Years of Education: N/A   Occupational History  . 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 Smoking status: Current Every Day Smoker -- 45 years    Types: Cigarettes  . Smokeless tobacco: Never Used     Comment: 5 cigarettes daily  . Alcohol Use: No  . Drug Use: No     Comment: has used marijuana and cocaine in past  . Sexual Activity: Not Currently   Other Topics Concern  . Not on file   Social History Narrative   2 grown sons- one in [place] and one in [place]   Legally separated   Lives alone   Retired Naval architect- shipping/receiving   Enjoys walking, bowling, pool    Past Surgical History  Procedure Laterality Date  . Nasal septum surgery      30 YRS AGO  . Tee without cardioversion  04/26/2011    Procedure: TRANSESOPHAGEAL ECHOCARDIOGRAM (TEE);  Surgeon: [name], MD;  Location: [facility] ENDOSCOPY;  Service: Cardiovascular;  Laterality: N/A;  . Cardioversion  04/26/2011    Procedure: CARDIOVERSION;  Surgeon: [name], MD;  Location: [facility] ENDOSCOPY;  Service: Cardiovascular;  Laterality: N/A;  . Cardioversion  05/24/2011    Procedure: CARDIOVERSION;  Surgeon: [name], MD;  Location: [facility] OR;  Service: Cardiovascular;  Laterality:  N/A;    Family History  Problem Relation Age of Onset  . Lung cancer Mother     died from CA at age 35  . Lung cancer Father     died from CA at age 66  . Coronary artery disease Father 43    Pt reports his father had a "balloon placed through his arteries."  . Malignant hyperthermia Neg Hx     No Known Allergies  Current Outpatient Prescriptions on File Prior to Visit  Medication Sig Dispense Refill  . alendronate (FOSAMAX) 70 MG tablet TAKE ONE TABLET BY MOUTH ONCE A WEEK WITH  A  FULL  GLASS  OF  WATER  ON  AN  EMPTY  STOMACH.  SIT  UPRIGHT  FOR  90  MINUTES  AFTER  TAKIN  4 tablet  5  . aspirin 81 MG chewable tablet Chew 1 tablet (81 mg total) by mouth daily.       . cetirizine (ZYRTEC) 10 MG tablet Take 10 mg by mouth daily.      . CRESTOR 20 MG tablet TAKE ONE TABLET BY MOUTH ONCE DAILY  30 tablet  2  . digoxin (LANOXIN) 0.125 MG tablet Take 1 tablet (0.125 mg total) by mouth daily.  30 tablet  6  . fenofibrate (TRICOR) 145 MG tablet Take 1 tablet (145 mg total) by mouth daily.  30 tablet  5  . furosemide (LASIX) 40 MG tablet Take 1 tablet (40 mg total) by mouth daily.  30 tablet  6  . glimepiride (AMARYL) 4 MG tablet TAKE ONE TABLET BY MOUTH ONCE DAILY BEFORE  BREAKFAST  30 tablet  2  . glucose blood test strip Use as instructed  100 each  12  . glucose monitoring kit (FREESTYLE) monitoring kit 1 each by Does not apply route as needed for other. Use to check Blood sugar Three times a day (fasting in the morning; around lung and before bedtime ; last one before lantus injection)  1 each  0  . insulin glargine (LANTUS SOLOSTAR) 100 UNIT/ML injection Inject 50 Units into the skin at bedtime.  3 mL  3  . Lancets (FREESTYLE) lancets Use as instructed  100 each  12  . lisinopril (PRINIVIL,ZESTRIL) 5 MG tablet Take 1 tablet (5 mg total) by mouth daily.  30 tablet  6  . loratadine (CLARITIN) 10 MG tablet Take 10 mg by mouth daily.      . metoprolol (LOPRESSOR) 100 MG tablet Take 1 tablet (100 mg total) by mouth 2 (two) times daily.  60 tablet  5  . potassium chloride (K-DUR,KLOR-CON) 10 MEQ tablet Take 1 tablet (10 mEq total) by mouth daily.  30 tablet  6  . ULTICARE MICRO PEN NEEDLES 32G X 4 MM MISC USE AS DIRECTED  100 each  01  . warfarin (COUMADIN) 5 MG tablet Take as directed by anticoagulation clinic  35 tablet  3   No current facility-administered medications on file prior to visit.    BP 100/70  Pulse 85  Temp(Src) 97.7 F (36.5 C) (Oral)  Resp 16  Ht 6\' 2"  (1.88 m)  Wt 258 lb (117.028 kg)  BMI 33.11 kg/m2  SpO2 96%       Objective:   Physical Exam  Constitutional: He is oriented to person, place, and time. He appears well-nourished.   Cardiovascular: Normal rate and normal heart sounds.   No murmur heard. Pulmonary/Chest: Effort normal and breath sounds normal. No respiratory distress.  Neurological:  He is alert and oriented to person, place, and time.  Skin: Skin is warm and dry. No rash noted.  Psychiatric: He has a normal mood and affect.          Assessment & Plan:

## 2013-02-15 ENCOUNTER — Telehealth: Payer: Self-pay | Admitting: Family

## 2013-02-15 MED ORDER — EZETIMIBE 10 MG PO TABS: 10.0000 mg | ORAL_TABLET | Freq: Every day | ORAL | Status: AC

## 2013-02-15 NOTE — Telephone Encounter (Signed)
Kidney function and K+ elevated.  Stop lisinopril, stop Kdur, repeat bmet in 1 week with nurse visit to check BP.

## 2013-02-15 NOTE — Telephone Encounter (Signed)
Triglycerides also still high. Add zetia.  Plan repeat flp/lft in 3 mos

## 2013-02-16 NOTE — Telephone Encounter (Signed)
Left message on voicemail to return my call.  

## 2013-02-21 NOTE — Telephone Encounter (Signed)
Notified pt and he voices understanding. Nurse visit scheduled for 02/28/13 at 10:45am. Pt declines to schedule 3 month follow up as he states he is going to be transferring his care to the [place] [facility] in December.

## 2013-02-27 ENCOUNTER — Other Ambulatory Visit: Payer: Self-pay | Admitting: Cardiovascular Disease

## 2013-02-28 ENCOUNTER — Ambulatory Visit (INDEPENDENT_AMBULATORY_CARE_PROVIDER_SITE_OTHER): Payer: BC Managed Care – PPO | Admitting: Family

## 2013-02-28 VITALS — BP 126/86 | HR 75 | Temp 97.5°F | Resp 16 | Wt 254.1 lb

## 2013-02-28 DIAGNOSIS — E875 Hyperkalemia: Secondary | ICD-10-CM

## 2013-02-28 DIAGNOSIS — N182 Chronic kidney disease, stage 2 (mild): Secondary | ICD-10-CM

## 2013-02-28 LAB — BASIC METABOLIC PANEL
BUN: 15 mg/dL (ref 6–23)
Calcium: 10 mg/dL (ref 8.4–10.5)
Chloride: 104 mEq/L (ref 96–112)
Glucose, Bld: 83 mg/dL (ref 70–99)
Potassium: 4.6 mEq/L (ref 3.5–5.3)

## 2013-02-28 NOTE — Assessment & Plan Note (Signed)
BP Readings from Last 3 Encounters:  02/28/13 126/86  02/12/13 100/70  12/11/12 96/67   Pt presents today for nurse visit. Follow up bp stable following discontinuation of ace (creatinine up to 1.4 from baseline 1.1).  Also K+ was elevated at 5.4 and he discontinued K supplement.  Repeat bmet today.

## 2013-03-01 ENCOUNTER — Encounter: Payer: Self-pay | Admitting: Family

## 2013-03-08 ENCOUNTER — Ambulatory Visit (INDEPENDENT_AMBULATORY_CARE_PROVIDER_SITE_OTHER): Payer: BC Managed Care – PPO | Admitting: *Deleted

## 2013-03-08 DIAGNOSIS — I4891 Unspecified atrial fibrillation: Secondary | ICD-10-CM

## 2013-03-08 DIAGNOSIS — Z7901 Long term (current) use of anticoagulants: Secondary | ICD-10-CM

## 2013-10-10 ENCOUNTER — Telehealth: Payer: Self-pay

## 2013-10-10 NOTE — Telephone Encounter (Signed)
Diabetic Bundle:  Patient states he is no longer seeing Sandford CrazeMelissa O'sullivan that [facility] now

## 2013-11-26 ENCOUNTER — Telehealth: Payer: Self-pay | Admitting: Family

## 2013-11-26 NOTE — Telephone Encounter (Signed)
Left patient a voicemail to contact back to schedule a 6 month fu with Dr. Peggyann Juba.

## 2014-03-07 ENCOUNTER — Encounter (HOSPITAL_COMMUNITY): Payer: Self-pay | Admitting: Cardiovascular Disease

## 2014-04-25 IMAGING — CR DG CHEST 1V PORT
2 series · 2 of 2 positions shown · non-contrast
Comparison: One-view chest 04/25/2011.

CLINICAL DATA: Dizziness and cough.

PORTABLE CHEST - 1 VIEW

[AP (1 of 2)]
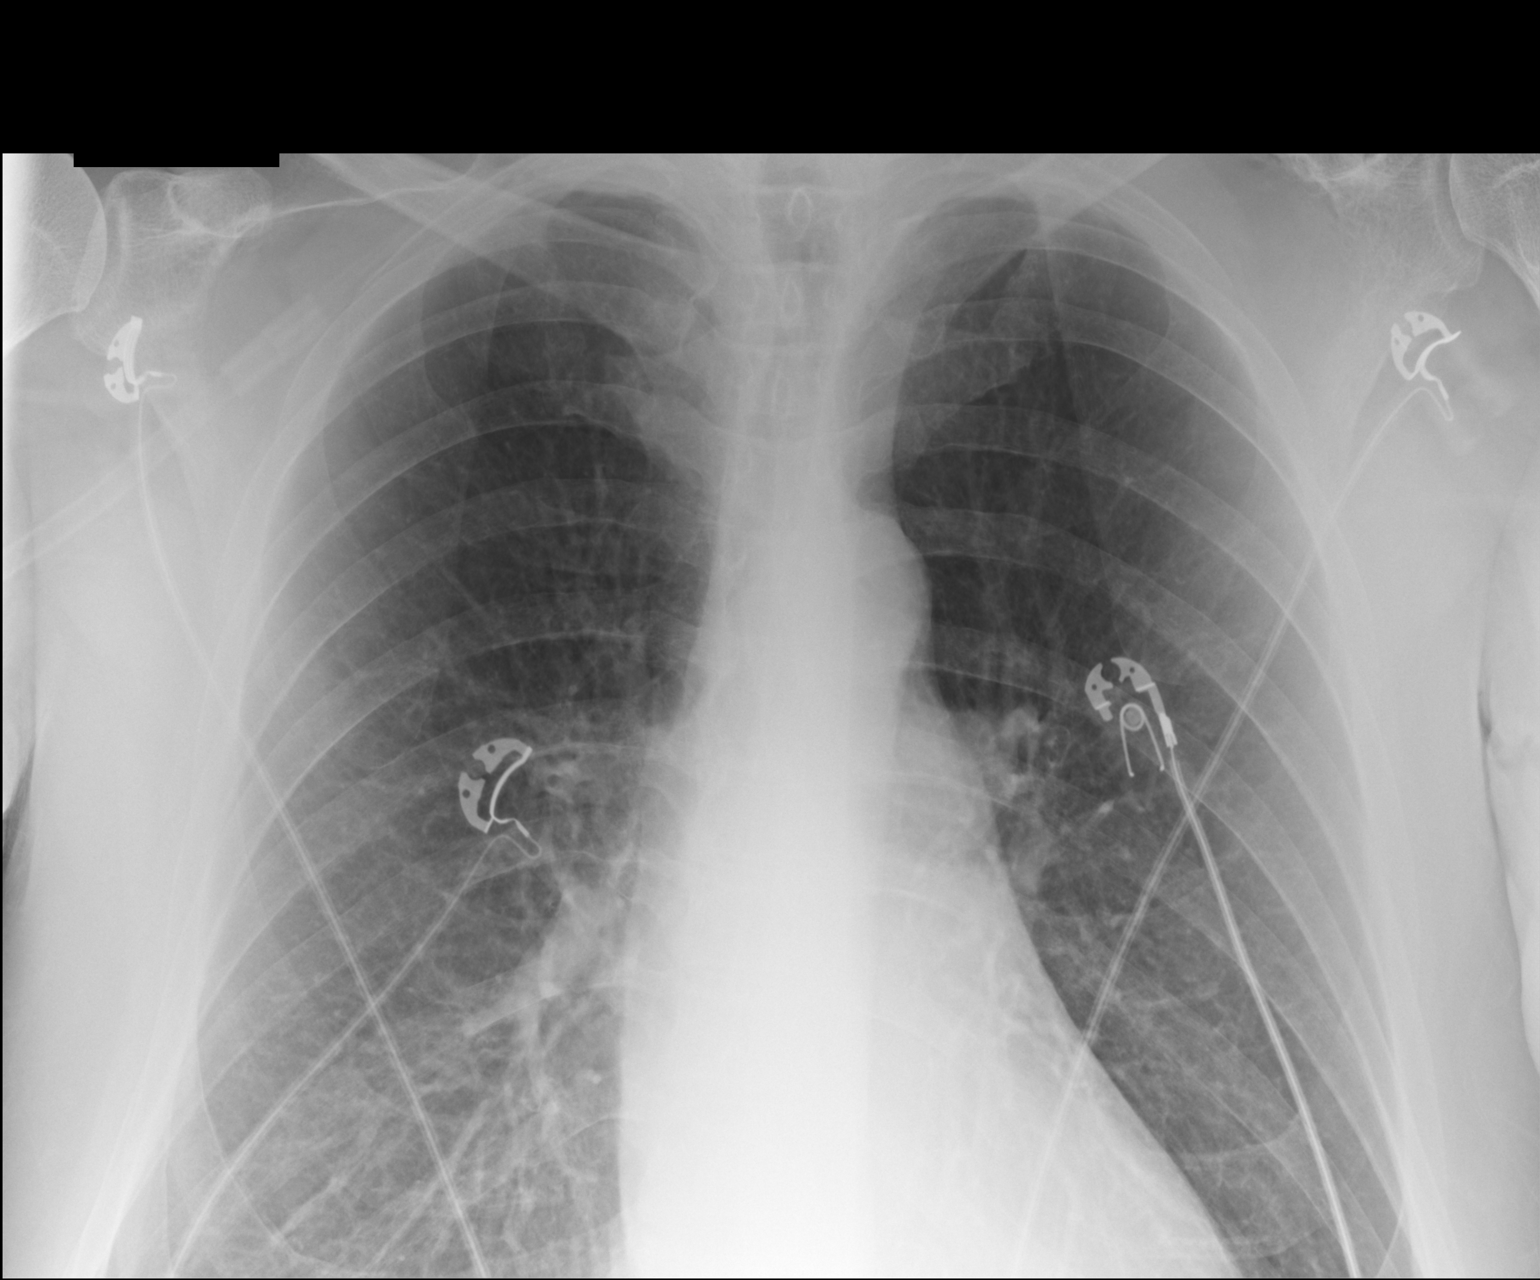

[AP (2 of 2)]
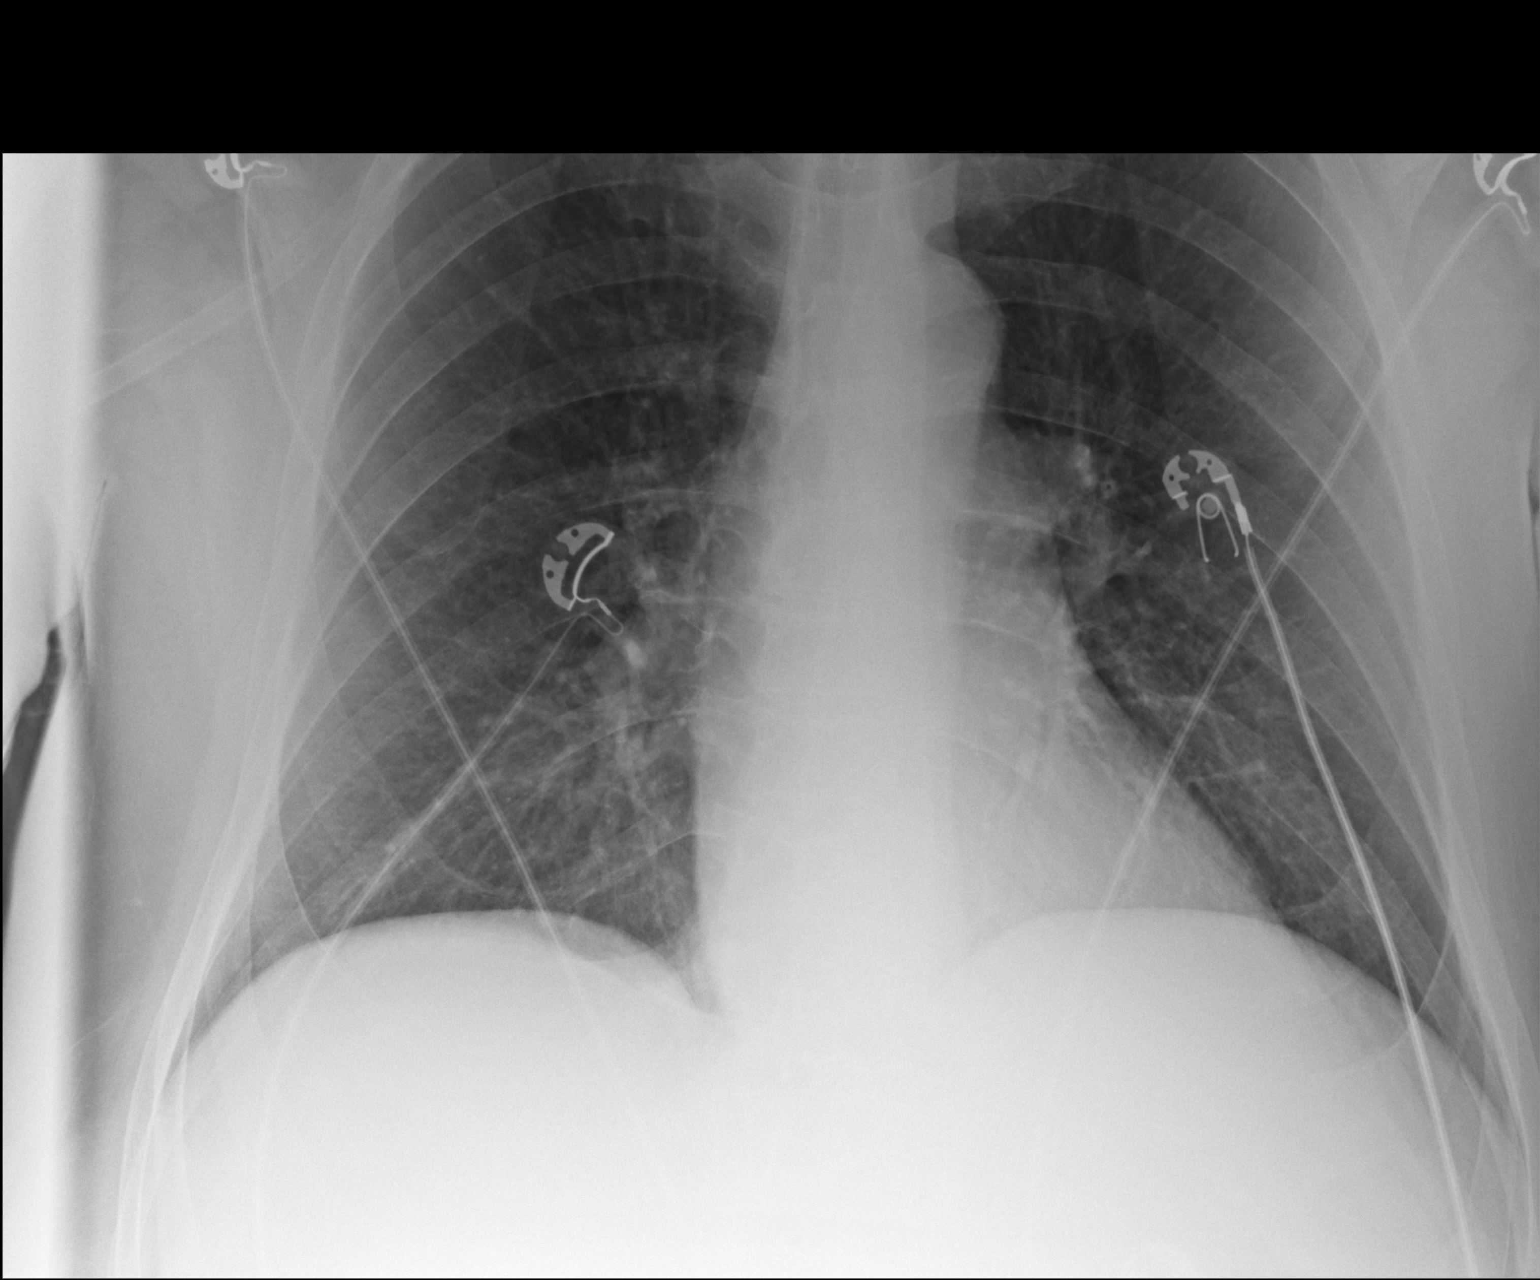

[2 of 2 positions shown; findings below may reference images not displayed]

FINDINGS: Heart size is normal.  The lungs are clear.  The
visualized soft tissues and bony thorax are unremarkable.
IMPRESSION: Negative chest.

## 2014-07-14 IMAGING — CT CT ABDOMEN WO/W CM
3 of 10 series · 12 of 46 positions shown, 18 images · IV contrast (APPLIED)
Comparison: CT of the abdomen and pelvis 07/18/2012.

CLINICAL DATA: 66-year-old male with renal mass noted on recent
noncontrast CT of the abdomen and pelvis.

CT ABDOMEN WITHOUT AND WITH CONTRAST
TECHNIQUE: Multidetector CT imaging of the abdomen was performed
following the standard protocol before and during bolus
administration of intravenous contrast.
Contrast: 100mL OMNIPAQUE IOHEXOL 350 MG/ML SOLN

[Series 3: renal w/o 3.0 · axial · non-contrast · 0.85mm/px · z∈[+994,+1226]mm · 8 of 99 slices shown, 13 images]
[im 11/99  soft-tissue]
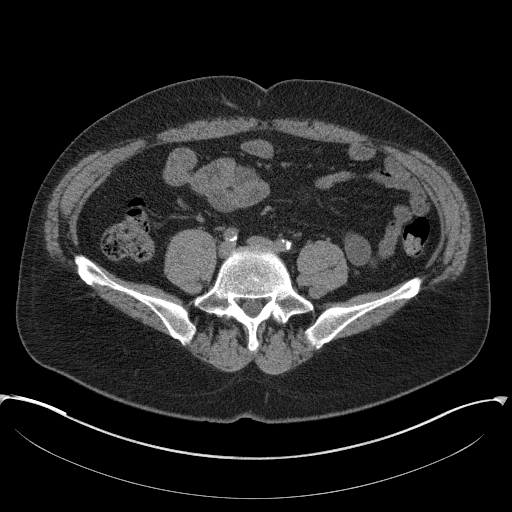
[im 11/99  bone]
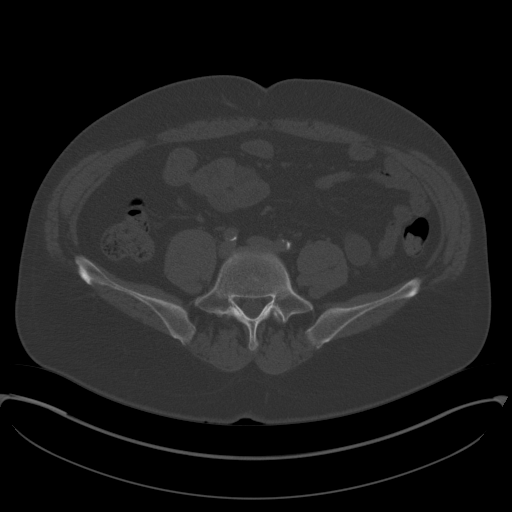
[im 22/99  soft-tissue]
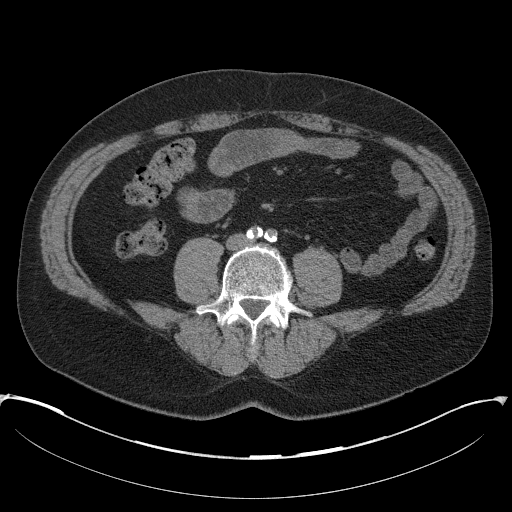
[im 33/99  soft-tissue]
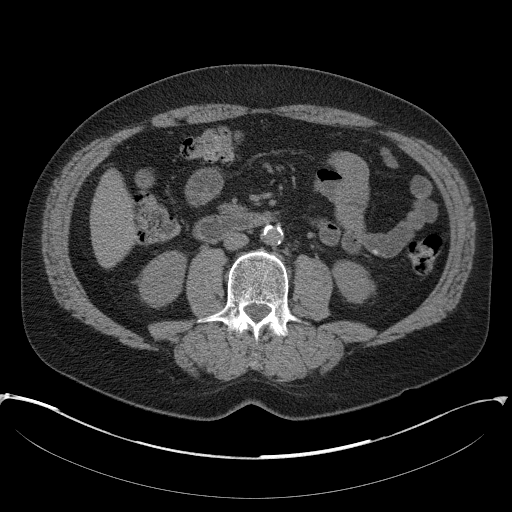
[im 44/99  soft-tissue]
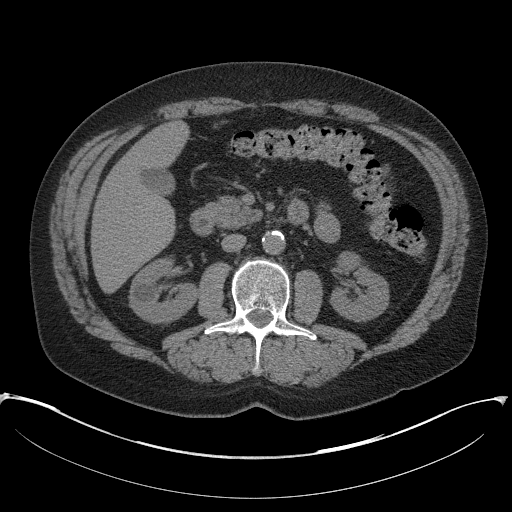
[im 55/99  soft-tissue]
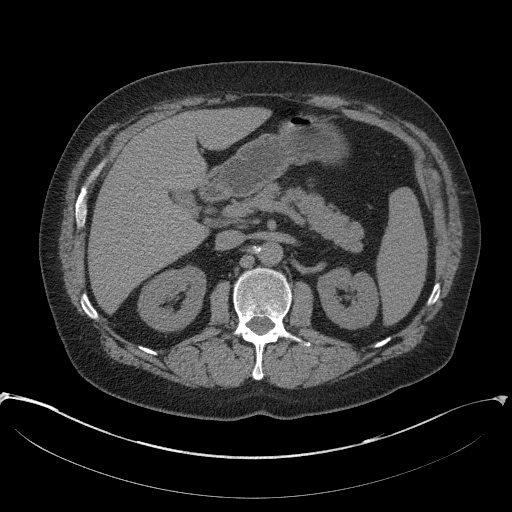
[im 55/99  lung]
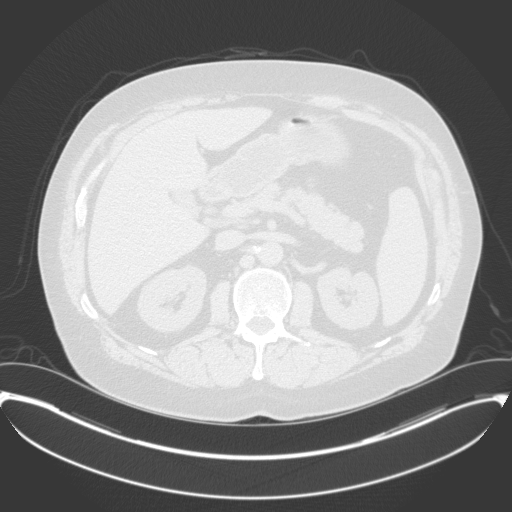
[im 66/99  soft-tissue]
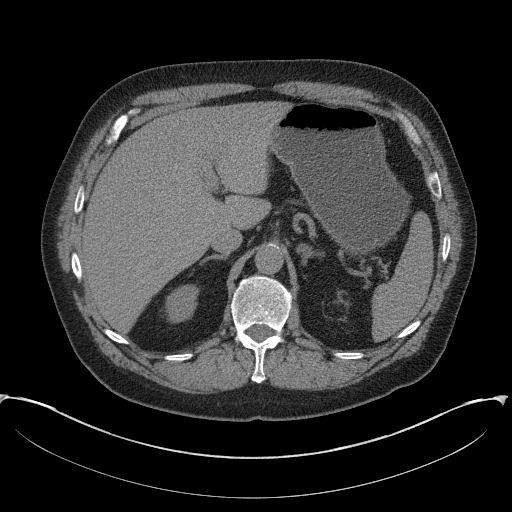
[im 66/99  lung]
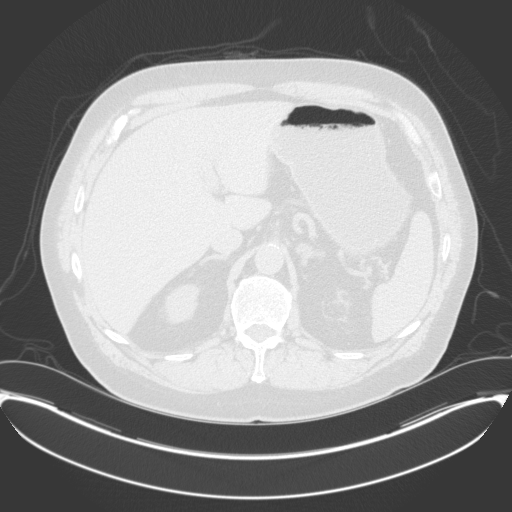
[im 77/99  soft-tissue]
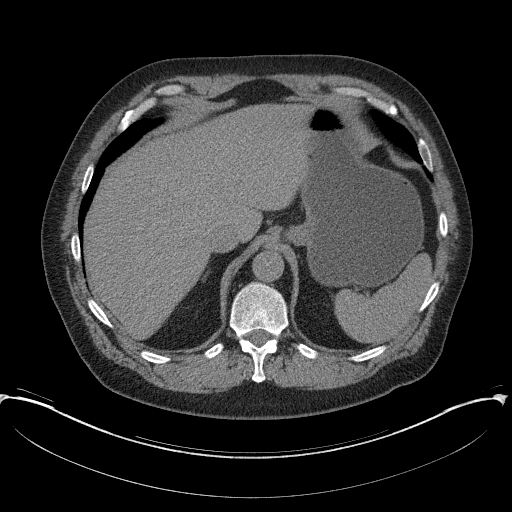
[im 77/99  lung]
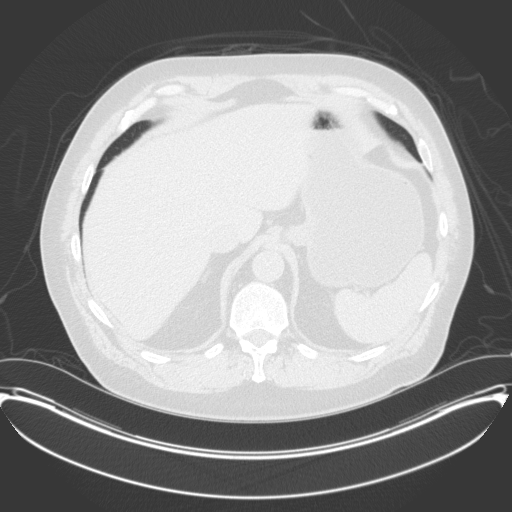
[im 88/99  soft-tissue]
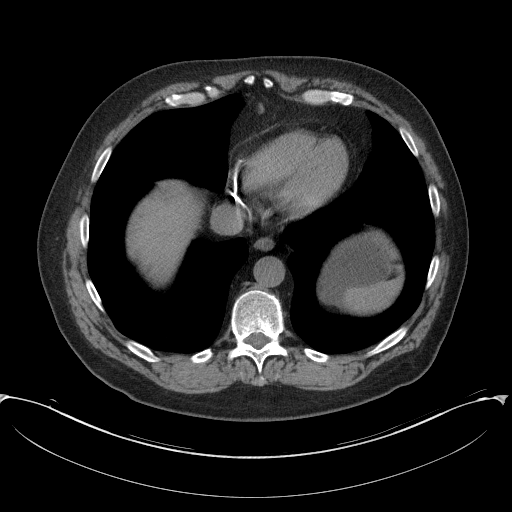
[im 88/99  lung]
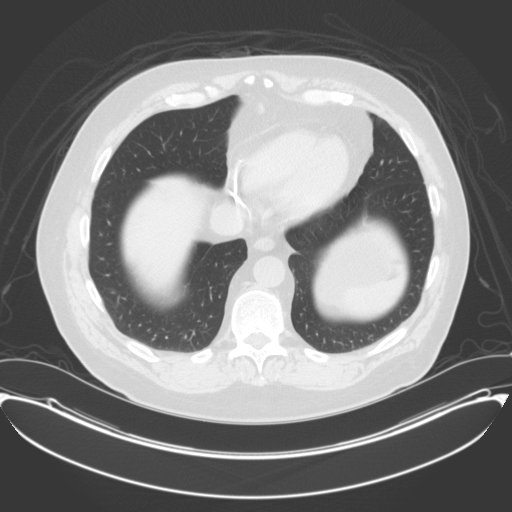

[Series 6: arterial 3.0 · axial · arterial · 0.85mm/px · z∈[+994,+1028]mm · 2 of 99 slices shown]
[im 11/99  soft-tissue]
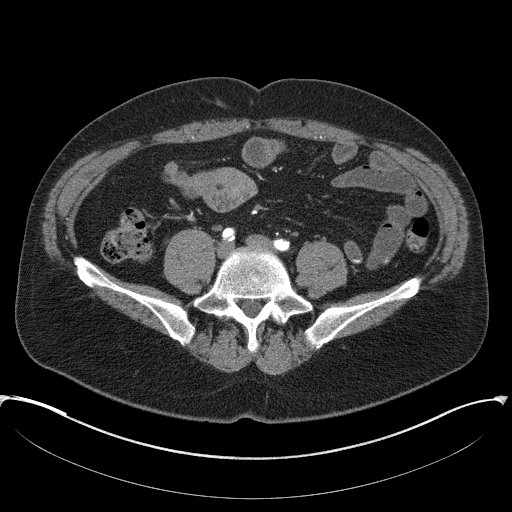
[im 22/99  soft-tissue]
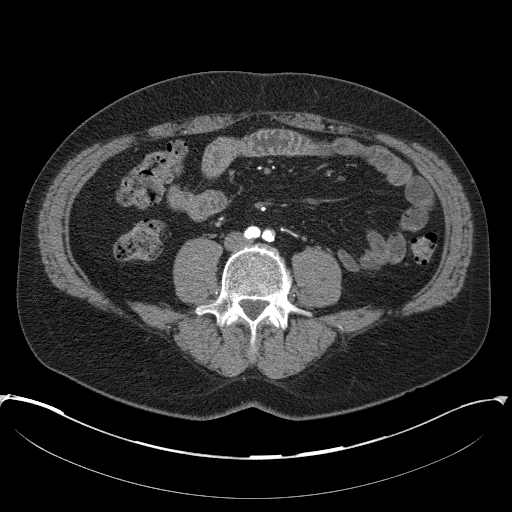

[Series 8: renal w/o 3.0 coronal · coronal · non-contrast · 0.81mm/px · 2 of 102 slices shown, 3 images]
[im 34/102  soft-tissue]
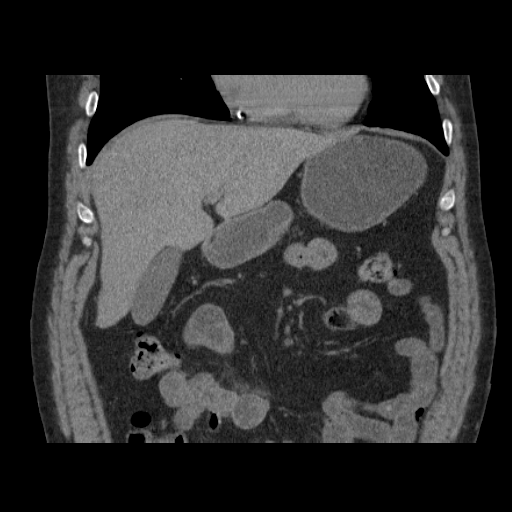
[im 34/102  bone]
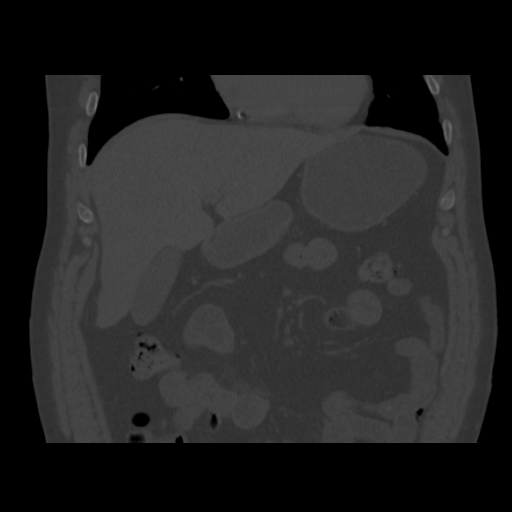
[im 68/102  soft-tissue]
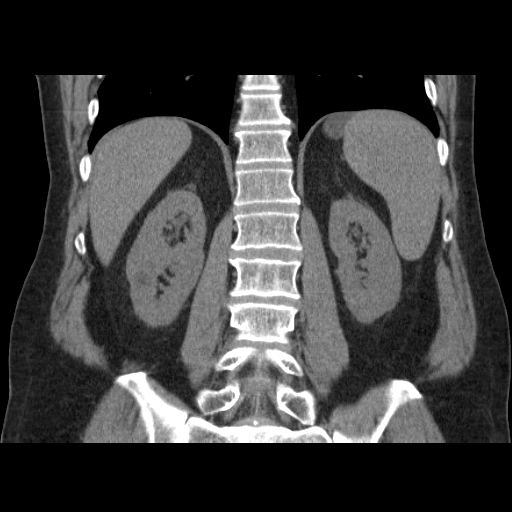

[12 of 46 positions shown; findings below may reference images not displayed]

FINDINGS: Lung Bases: Extensive atherosclerotic calcifications throughout the
mid and distal right coronary artery.

Abdomen/Pelvis:  The lesion of concern in the interpolar region of
the right kidney measures approximately 1.9 cm in diameter and is
well defined without evidence of internal enhancement, compatible
with a simple cyst.  There are subcentimeter low attenuation
lesions in the lateral aspect of the interpolar region of the left
kidney and in the lower pole of the right kidney which are too
small to definitively characterize, but are also favored to
represent a small cysts.  No suspicious-appearing enhancing renal
neoplasm is noted on today's examination.

The appearance of the liver, gallbladder, pancreas, spleen and
bilateral adrenal glands is unremarkable.  Extensive
atherosclerosis throughout the abdominal vasculature, without
definite aneurysm or dissection.  Within the visualized peritoneal
cavity there is no significant volume of ascites, no
pneumoperitoneum and no pathologic distension of small bowel.  No
definite pathologic lymphadenopathy within the visualized portions
of the abdomen.

Musculoskeletal: There are no aggressive appearing lytic or blastic
lesions noted in the visualized portions of the skeleton.
IMPRESSION: 1.  The lesion in question in the right kidney is compatible with a
simple cyst.  Smaller lesions in the kidneys bilaterally are too
small to definitively characterize, but are also favored to
represent small cysts.
2. Atherosclerosis, including right coronary artery disease.
Assessment for potential risk factor modification, dietary therapy
or pharmacologic therapy may be warranted, if clinically indicated.

## 2024-03-29 DEATH — deceased
